# Patient Record
Sex: Female | Born: 1965
Health system: Southern US, Community
[De-identification: ages and names within clinical notes are randomized; demographics above are authoritative.]

## PROBLEM LIST (undated history)

## (undated) DIAGNOSIS — R519 Headache, unspecified: Secondary | ICD-10-CM

## (undated) DIAGNOSIS — D219 Benign neoplasm of connective and other soft tissue, unspecified: Secondary | ICD-10-CM

## (undated) DIAGNOSIS — R51 Headache: Secondary | ICD-10-CM

## (undated) DIAGNOSIS — A599 Trichomoniasis, unspecified: Secondary | ICD-10-CM

## (undated) DIAGNOSIS — I10 Essential (primary) hypertension: Secondary | ICD-10-CM

## (undated) HISTORY — PX: TUBAL LIGATION: SHX77

## (undated) HISTORY — PX: EYE SURGERY: SHX253

---

## 1998-08-08 ENCOUNTER — Inpatient Hospital Stay (HOSPITAL_COMMUNITY): Admission: AD | Admit: 1998-08-08 | Discharge: 1998-08-08 | Payer: Self-pay | Admitting: *Deleted

## 1998-08-09 ENCOUNTER — Inpatient Hospital Stay (HOSPITAL_COMMUNITY): Admission: AD | Admit: 1998-08-09 | Discharge: 1998-08-09 | Payer: Self-pay | Admitting: Obstetrics

## 1998-09-27 ENCOUNTER — Encounter: Admission: RE | Admit: 1998-09-27 | Discharge: 1998-09-27 | Payer: Self-pay | Admitting: Family Medicine

## 1998-10-06 ENCOUNTER — Encounter: Admission: RE | Admit: 1998-10-06 | Discharge: 1998-10-06 | Payer: Self-pay | Admitting: Family Medicine

## 1998-10-20 ENCOUNTER — Other Ambulatory Visit: Admission: RE | Admit: 1998-10-20 | Discharge: 1998-10-20 | Payer: Self-pay | Admitting: Family Medicine

## 1998-10-20 ENCOUNTER — Encounter: Admission: RE | Admit: 1998-10-20 | Discharge: 1998-10-20 | Payer: Self-pay | Admitting: Family Medicine

## 1998-10-23 ENCOUNTER — Ambulatory Visit (HOSPITAL_COMMUNITY): Admission: RE | Admit: 1998-10-23 | Discharge: 1998-10-23 | Payer: Self-pay | Admitting: Obstetrics

## 1998-11-17 ENCOUNTER — Encounter: Admission: RE | Admit: 1998-11-17 | Discharge: 1998-11-17 | Payer: Self-pay | Admitting: Family Medicine

## 1998-12-18 ENCOUNTER — Encounter: Admission: RE | Admit: 1998-12-18 | Discharge: 1998-12-18 | Payer: Self-pay | Admitting: Family Medicine

## 1999-01-15 ENCOUNTER — Inpatient Hospital Stay (HOSPITAL_COMMUNITY): Admission: AD | Admit: 1999-01-15 | Discharge: 1999-01-15 | Payer: Self-pay | Admitting: *Deleted

## 1999-01-19 ENCOUNTER — Encounter: Admission: RE | Admit: 1999-01-19 | Discharge: 1999-01-19 | Payer: Self-pay | Admitting: Family Medicine

## 1999-02-12 ENCOUNTER — Encounter: Admission: RE | Admit: 1999-02-12 | Discharge: 1999-02-12 | Payer: Self-pay | Admitting: Family Medicine

## 1999-03-19 ENCOUNTER — Encounter: Admission: RE | Admit: 1999-03-19 | Discharge: 1999-03-19 | Payer: Self-pay | Admitting: Family Medicine

## 1999-04-03 ENCOUNTER — Inpatient Hospital Stay (HOSPITAL_COMMUNITY): Admission: AD | Admit: 1999-04-03 | Discharge: 1999-04-03 | Payer: Self-pay | Admitting: *Deleted

## 1999-04-13 ENCOUNTER — Encounter: Admission: RE | Admit: 1999-04-13 | Discharge: 1999-04-13 | Payer: Self-pay | Admitting: Family Medicine

## 1999-04-18 ENCOUNTER — Encounter: Admission: RE | Admit: 1999-04-18 | Discharge: 1999-04-18 | Payer: Self-pay | Admitting: Family Medicine

## 1999-04-26 ENCOUNTER — Encounter: Payer: Self-pay | Admitting: Obstetrics & Gynecology

## 1999-04-26 ENCOUNTER — Inpatient Hospital Stay (HOSPITAL_COMMUNITY): Admission: AD | Admit: 1999-04-26 | Discharge: 1999-05-10 | Payer: Self-pay | Admitting: Obstetrics & Gynecology

## 1999-05-10 ENCOUNTER — Encounter: Payer: Self-pay | Admitting: Obstetrics & Gynecology

## 1999-05-15 ENCOUNTER — Inpatient Hospital Stay (HOSPITAL_COMMUNITY): Admission: AD | Admit: 1999-05-15 | Discharge: 1999-05-23 | Payer: Self-pay | Admitting: *Deleted

## 1999-05-21 ENCOUNTER — Encounter: Payer: Self-pay | Admitting: *Deleted

## 1999-05-21 ENCOUNTER — Encounter: Admission: RE | Admit: 1999-05-21 | Discharge: 1999-05-21 | Payer: Self-pay | Admitting: Family Medicine

## 1999-05-29 ENCOUNTER — Encounter: Admission: RE | Admit: 1999-05-29 | Discharge: 1999-05-29 | Payer: Self-pay | Admitting: Sports Medicine

## 1999-05-31 ENCOUNTER — Inpatient Hospital Stay (HOSPITAL_COMMUNITY): Admission: AD | Admit: 1999-05-31 | Discharge: 1999-06-11 | Payer: Self-pay | Admitting: Obstetrics & Gynecology

## 1999-06-13 ENCOUNTER — Encounter: Admission: RE | Admit: 1999-06-13 | Discharge: 1999-09-11 | Payer: Self-pay | Admitting: Obstetrics & Gynecology

## 1999-07-17 ENCOUNTER — Other Ambulatory Visit: Admission: RE | Admit: 1999-07-17 | Discharge: 1999-08-08 | Payer: Self-pay | Admitting: Family Medicine

## 1999-07-17 ENCOUNTER — Encounter: Admission: RE | Admit: 1999-07-17 | Discharge: 1999-07-17 | Payer: Self-pay | Admitting: Sports Medicine

## 2001-07-29 ENCOUNTER — Ambulatory Visit (HOSPITAL_COMMUNITY): Admission: RE | Admit: 2001-07-29 | Discharge: 2001-07-29 | Payer: Self-pay | Admitting: Obstetrics and Gynecology

## 2002-02-28 ENCOUNTER — Emergency Department (HOSPITAL_COMMUNITY): Admission: EM | Admit: 2002-02-28 | Discharge: 2002-02-28 | Payer: Self-pay | Admitting: Emergency Medicine

## 2003-03-24 ENCOUNTER — Emergency Department (HOSPITAL_COMMUNITY): Admission: EM | Admit: 2003-03-24 | Discharge: 2003-03-25 | Payer: Self-pay | Admitting: Emergency Medicine

## 2003-07-12 ENCOUNTER — Other Ambulatory Visit: Admission: RE | Admit: 2003-07-12 | Discharge: 2003-07-12 | Payer: Self-pay | Admitting: Obstetrics and Gynecology

## 2003-07-13 ENCOUNTER — Encounter: Admission: RE | Admit: 2003-07-13 | Discharge: 2003-07-13 | Payer: Self-pay | Admitting: Family Medicine

## 2003-08-11 ENCOUNTER — Emergency Department (HOSPITAL_COMMUNITY): Admission: EM | Admit: 2003-08-11 | Discharge: 2003-08-11 | Payer: Self-pay | Admitting: Emergency Medicine

## 2004-04-11 ENCOUNTER — Emergency Department (HOSPITAL_COMMUNITY): Admission: EM | Admit: 2004-04-11 | Discharge: 2004-04-11 | Payer: Self-pay | Admitting: Emergency Medicine

## 2005-03-27 ENCOUNTER — Other Ambulatory Visit: Admission: RE | Admit: 2005-03-27 | Discharge: 2005-03-27 | Payer: Self-pay | Admitting: Obstetrics and Gynecology

## 2005-03-28 ENCOUNTER — Other Ambulatory Visit: Admission: RE | Admit: 2005-03-28 | Discharge: 2005-03-28 | Payer: Self-pay | Admitting: Obstetrics and Gynecology

## 2005-05-09 ENCOUNTER — Encounter: Admission: RE | Admit: 2005-05-09 | Discharge: 2005-05-09 | Payer: Self-pay | Admitting: Endocrinology

## 2005-06-03 ENCOUNTER — Emergency Department (HOSPITAL_COMMUNITY): Admission: EM | Admit: 2005-06-03 | Discharge: 2005-06-03 | Payer: Self-pay | Admitting: Emergency Medicine

## 2006-08-28 ENCOUNTER — Other Ambulatory Visit: Admission: RE | Admit: 2006-08-28 | Discharge: 2006-08-28 | Payer: Self-pay | Admitting: Obstetrics and Gynecology

## 2009-09-20 ENCOUNTER — Other Ambulatory Visit: Admission: RE | Admit: 2009-09-20 | Discharge: 2009-09-20 | Payer: Self-pay | Admitting: Obstetrics and Gynecology

## 2009-09-22 ENCOUNTER — Encounter: Admission: RE | Admit: 2009-09-22 | Discharge: 2009-09-22 | Payer: Self-pay | Admitting: Obstetrics and Gynecology

## 2010-12-06 ENCOUNTER — Other Ambulatory Visit (HOSPITAL_COMMUNITY)
Admission: RE | Admit: 2010-12-06 | Discharge: 2010-12-06 | Disposition: A | Discharge status: Home / Self Care | Payer: Medicaid Other | Source: Ambulatory Visit | Attending: Obstetrics and Gynecology | Admitting: Obstetrics and Gynecology

## 2010-12-06 ENCOUNTER — Other Ambulatory Visit: Payer: Self-pay | Admitting: Obstetrics and Gynecology

## 2010-12-06 DIAGNOSIS — Z01419 Encounter for gynecological examination (general) (routine) without abnormal findings: Secondary | ICD-10-CM | POA: Insufficient documentation

## 2010-12-06 DIAGNOSIS — Z113 Encounter for screening for infections with a predominantly sexual mode of transmission: Secondary | ICD-10-CM | POA: Insufficient documentation

## 2010-12-07 ENCOUNTER — Other Ambulatory Visit: Payer: Self-pay | Admitting: Obstetrics and Gynecology

## 2010-12-07 DIAGNOSIS — Z1231 Encounter for screening mammogram for malignant neoplasm of breast: Secondary | ICD-10-CM

## 2010-12-18 ENCOUNTER — Ambulatory Visit
Admission: RE | Admit: 2010-12-18 | Discharge: 2010-12-18 | Disposition: A | Discharge status: Home / Self Care | Payer: Medicaid Other | Source: Ambulatory Visit | Attending: Obstetrics and Gynecology | Admitting: Obstetrics and Gynecology

## 2010-12-18 DIAGNOSIS — Z1231 Encounter for screening mammogram for malignant neoplasm of breast: Secondary | ICD-10-CM

## 2011-02-14 ENCOUNTER — Emergency Department (HOSPITAL_COMMUNITY)
Admission: EM | Admit: 2011-02-14 | Discharge: 2011-02-15 | Disposition: A | Discharge status: Home / Self Care | Payer: Medicaid Other | Attending: Emergency Medicine | Admitting: Emergency Medicine

## 2011-02-14 DIAGNOSIS — R51 Headache: Secondary | ICD-10-CM | POA: Insufficient documentation

## 2011-02-15 ENCOUNTER — Emergency Department (HOSPITAL_COMMUNITY): Payer: Medicaid Other

## 2011-02-15 ENCOUNTER — Encounter (HOSPITAL_COMMUNITY): Payer: Self-pay

## 2011-02-15 LAB — POCT I-STAT, CHEM 8
BUN: 8 mg/dL (ref 6–23)
Calcium, Ion: 1.12 mmol/L (ref 1.12–1.32)
Chloride: 106 mEq/L (ref 96–112)
Creatinine, Ser: 0.6 mg/dL (ref 0.50–1.10)
Glucose, Bld: 117 mg/dL — ABNORMAL HIGH (ref 70–99)
HCT: 37 % (ref 36.0–46.0)
Hemoglobin: 12.6 g/dL (ref 12.0–15.0)
Potassium: 3.2 mEq/L — ABNORMAL LOW (ref 3.5–5.1)
Sodium: 141 mEq/L (ref 135–145)
TCO2: 18 mmol/L (ref 0–100)

## 2011-04-27 ENCOUNTER — Emergency Department (HOSPITAL_COMMUNITY)
Admission: EM | Admit: 2011-04-27 | Discharge: 2011-04-27 | Disposition: A | Discharge status: Home / Self Care | Payer: Medicaid Other | Attending: Emergency Medicine | Admitting: Emergency Medicine

## 2011-04-27 ENCOUNTER — Emergency Department (HOSPITAL_COMMUNITY): Payer: Medicaid Other

## 2011-04-27 DIAGNOSIS — M7989 Other specified soft tissue disorders: Secondary | ICD-10-CM | POA: Insufficient documentation

## 2011-04-27 DIAGNOSIS — M79609 Pain in unspecified limb: Secondary | ICD-10-CM | POA: Insufficient documentation

## 2011-04-27 DIAGNOSIS — W208XXA Other cause of strike by thrown, projected or falling object, initial encounter: Secondary | ICD-10-CM | POA: Insufficient documentation

## 2012-02-07 ENCOUNTER — Other Ambulatory Visit: Payer: Self-pay | Admitting: Family Medicine

## 2012-02-07 DIAGNOSIS — Z1231 Encounter for screening mammogram for malignant neoplasm of breast: Secondary | ICD-10-CM

## 2012-02-19 ENCOUNTER — Other Ambulatory Visit: Payer: Self-pay | Admitting: Family Medicine

## 2012-02-19 DIAGNOSIS — N926 Irregular menstruation, unspecified: Secondary | ICD-10-CM

## 2012-02-20 ENCOUNTER — Other Ambulatory Visit: Payer: Medicaid Other

## 2012-02-21 ENCOUNTER — Ambulatory Visit
Admission: RE | Admit: 2012-02-21 | Discharge: 2012-02-21 | Disposition: A | Discharge status: Home / Self Care | Payer: Medicaid Other | Source: Ambulatory Visit | Attending: Family Medicine | Admitting: Family Medicine

## 2012-02-21 ENCOUNTER — Ambulatory Visit: Payer: Medicaid Other

## 2012-02-21 DIAGNOSIS — N926 Irregular menstruation, unspecified: Secondary | ICD-10-CM

## 2013-01-12 ENCOUNTER — Emergency Department (HOSPITAL_COMMUNITY)
Admission: EM | Admit: 2013-01-12 | Discharge: 2013-01-13 | Disposition: A | Discharge status: Home / Self Care | Payer: No Typology Code available for payment source | Attending: Emergency Medicine | Admitting: Emergency Medicine

## 2013-01-12 ENCOUNTER — Encounter (HOSPITAL_COMMUNITY): Payer: Self-pay | Admitting: Emergency Medicine

## 2013-01-12 DIAGNOSIS — Y92410 Unspecified street and highway as the place of occurrence of the external cause: Secondary | ICD-10-CM | POA: Insufficient documentation

## 2013-01-12 DIAGNOSIS — S0990XA Unspecified injury of head, initial encounter: Secondary | ICD-10-CM | POA: Insufficient documentation

## 2013-01-12 DIAGNOSIS — S46909A Unspecified injury of unspecified muscle, fascia and tendon at shoulder and upper arm level, unspecified arm, initial encounter: Secondary | ICD-10-CM | POA: Insufficient documentation

## 2013-01-12 DIAGNOSIS — Y9389 Activity, other specified: Secondary | ICD-10-CM | POA: Insufficient documentation

## 2013-01-12 DIAGNOSIS — Z9889 Other specified postprocedural states: Secondary | ICD-10-CM | POA: Insufficient documentation

## 2013-01-12 DIAGNOSIS — S0993XA Unspecified injury of face, initial encounter: Secondary | ICD-10-CM | POA: Insufficient documentation

## 2013-01-12 DIAGNOSIS — S4980XA Other specified injuries of shoulder and upper arm, unspecified arm, initial encounter: Secondary | ICD-10-CM | POA: Insufficient documentation

## 2013-01-12 DIAGNOSIS — H11009 Unspecified pterygium of unspecified eye: Secondary | ICD-10-CM | POA: Insufficient documentation
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
## 2013-01-12 NOTE — ED Notes (Signed)
Pt ambulatory to exam room with steady gait.  

## 2013-01-13 MED ORDER — METHOCARBAMOL 500 MG PO TABS: 500.0000 mg | ORAL_TABLET | Freq: Two times a day (BID) | ORAL | Status: DC

## 2013-01-13 MED ORDER — IBUPROFEN 800 MG PO TABS: 800.0000 mg | ORAL_TABLET | Freq: Three times a day (TID) | ORAL | Status: DC

## 2013-01-13 MED ORDER — IBUPROFEN 800 MG PO TABS
800.0000 mg | ORAL_TABLET | Freq: Once | ORAL | Status: AC
  Administered 2013-01-13: 800 mg via ORAL
  Filled 2013-01-13: qty 1

## 2013-01-15 NOTE — ED Provider Notes (Signed)
Medical screening examination/treatment/procedure(s) were performed by non-physician practitioner and as supervising physician I was immediately available for consultation/collaboration.    Celene Kras, MD 01/15/13 [phone]

## 2014-08-16 ENCOUNTER — Emergency Department (HOSPITAL_COMMUNITY)
Admission: EM | Admit: 2014-08-16 | Discharge: 2014-08-16 | Disposition: A | Discharge status: Home / Self Care | Payer: Self-pay | Attending: Emergency Medicine | Admitting: Emergency Medicine

## 2014-08-16 ENCOUNTER — Encounter (HOSPITAL_COMMUNITY): Payer: Self-pay | Admitting: Emergency Medicine

## 2014-08-16 ENCOUNTER — Emergency Department (HOSPITAL_COMMUNITY): Payer: Medicaid Other

## 2014-08-16 DIAGNOSIS — M549 Dorsalgia, unspecified: Secondary | ICD-10-CM

## 2014-08-16 DIAGNOSIS — M546 Pain in thoracic spine: Secondary | ICD-10-CM | POA: Insufficient documentation

## 2014-08-16 MED ORDER — HYDROCODONE-ACETAMINOPHEN 5-325 MG PO TABS: 1.0000 | ORAL_TABLET | ORAL | Status: DC | PRN

## 2014-08-16 MED ORDER — CYCLOBENZAPRINE HCL 10 MG PO TABS: 10.0000 mg | ORAL_TABLET | Freq: Three times a day (TID) | ORAL | Status: DC | PRN

## 2014-08-16 MED ORDER — OXYCODONE-ACETAMINOPHEN 5-325 MG PO TABS
1.0000 | ORAL_TABLET | Freq: Once | ORAL | Status: AC
  Administered 2014-08-16: 1 via ORAL
  Filled 2014-08-16: qty 1

## 2014-08-16 MED ORDER — KETOROLAC TROMETHAMINE 60 MG/2ML IM SOLN
60.0000 mg | Freq: Once | INTRAMUSCULAR | Status: AC
  Administered 2014-08-16: 60 mg via INTRAMUSCULAR
  Filled 2014-08-16: qty 2

## 2014-08-16 MED ORDER — IBUPROFEN 600 MG PO TABS: 600.0000 mg | ORAL_TABLET | Freq: Three times a day (TID) | ORAL | Status: DC | PRN

## 2014-08-16 MED ORDER — ONDANSETRON 4 MG PO TBDP
8.0000 mg | ORAL_TABLET | Freq: Once | ORAL | Status: AC
  Administered 2014-08-16: 8 mg via ORAL
  Filled 2014-08-16: qty 2

## 2014-08-16 MED ORDER — HYDROMORPHONE HCL 1 MG/ML IJ SOLN
1.0000 mg | Freq: Once | INTRAMUSCULAR | Status: AC
  Administered 2014-08-16: 1 mg via INTRAMUSCULAR
  Filled 2014-08-16: qty 1

## 2014-08-16 MED ORDER — DIAZEPAM 5 MG PO TABS
5.0000 mg | ORAL_TABLET | Freq: Once | ORAL | Status: AC
  Administered 2014-08-16: 5 mg via ORAL
  Filled 2014-08-16: qty 1

## 2014-08-16 NOTE — ED Provider Notes (Signed)
CSN: [id number]     Arrival date & time 08/16/14  0417 History   First MD Initiated Contact with Patient 08/16/14 0505     Chief Complaint  Patient presents with  . Back Pain      The history is provided by the patient.   Patient reports mid back pain since yesterday.  Worse with bending over and worse with movement.  She denies shortness of breath.  No recent injury or trauma.  Pain is moderate to severe in severity.  Nonpleuritic pain.  No shortness of breath.  No fevers or chills.  Denies productive cough.  No abdominal pain.  No low back pain.  No urinary complaints.   History reviewed. No pertinent past medical history. Past Surgical History  Procedure Laterality Date  . Eye surgery    . Tubal ligation     Family History  Problem Relation Age of Onset  . Obesity Other   . Hypertension Other   . Glaucoma Other   . Cancer Other    History  Substance Use Topics  . Smoking status: Never Smoker   . Smokeless tobacco: Not on file  . Alcohol Use: Yes     Comment: rare   OB History    No data available     Review of Systems  Musculoskeletal: Positive for back pain.  All other systems reviewed and are negative.     Allergies  Review of patient's allergies indicates no known allergies.  Home Medications   Prior to Admission medications   Medication Sig Start Date End Date Taking? Authorizing Provider  acetaminophen (TYLENOL) 325 MG tablet Take 650 mg by mouth every 6 (six) hours as needed for mild pain.   Yes Historical Provider, MD  caffeine 200 MG TABS tablet Take 200 mg by mouth every 4 (four) hours as needed (for pain).   Yes Historical Provider, MD  cyclobenzaprine (FLEXERIL) 10 MG tablet Take 1 tablet (10 mg total) by mouth 3 (three) times daily as needed for muscle spasms. 08/16/14   Hoy Morn, MD  HYDROcodone-acetaminophen (NORCO/VICODIN) 5-325 MG per tablet Take 1 tablet by mouth every 4 (four) hours as needed for moderate pain. 08/16/14   Hoy Morn, MD  ibuprofen (ADVIL,MOTRIN) 600 MG tablet Take 1 tablet (600 mg total) by mouth every 8 (eight) hours as needed. 08/16/14   Hoy Morn, MD  methocarbamol (ROBAXIN) 500 MG tablet Take 1 tablet (500 mg total) by mouth 2 (two) times daily. Patient not taking: Reported on 08/16/2014 01/13/13   Domenic Moras, PA-C  Multiple Vitamin (MULTIVITAMIN WITH MINERALS) TABS Take 1 tablet by mouth daily.    Historical Provider, MD   BP 129/75 mmHg  Pulse 98  Temp(Src) 97.5 F (36.4 C) (Oral)  Resp 18  SpO2 97% Physical Exam  Constitutional: She is oriented to person, place, and time. She appears well-developed and well-nourished. No distress.  HENT:  Head: Normocephalic and atraumatic.  Eyes: EOM are normal.  Neck: Normal range of motion.  Cardiovascular: Normal rate, regular rhythm and normal heart sounds.   Pulmonary/Chest: Effort normal and breath sounds normal.  Abdominal: Soft. She exhibits no distension. There is no tenderness.  Musculoskeletal: Normal range of motion.  Mild thoracic and parathoracic tenderness right greater than left.  No lumbar or paralumbar tenderness.  No cervical or paracervical tenderness  Neurological: She is alert and oriented to person, place, and time.  Skin: Skin is warm and dry.  Psychiatric: She has a normal  mood and affect. Judgment normal.  Nursing note and vitals reviewed.   ED Course  Procedures (including critical care time) Labs Review Labs Reviewed - No data to display  Imaging Review Dg Chest 2 View  08/16/2014   CLINICAL DATA:  Acute onset of mid back discomfort, extending about the left side. Initial encounter.  EXAM: CHEST  2 VIEW  COMPARISON:  Chest radiograph performed 06/03/2005  FINDINGS: The lungs are well-aerated. Pulmonary vascularity is at the upper limits of normal. There is no evidence of focal opacification, pleural effusion or pneumothorax.  The heart is borderline normal in size. No acute osseous abnormalities are seen.   IMPRESSION: No acute cardiopulmonary process seen.   Electronically Signed   By: Garald Balding M.D.   On: 08/16/2014 05:56   Dg Thoracic Spine 2 View  08/16/2014   CLINICAL DATA:  Acute onset of mid back discomfort, extending about the left side. Initial encounter.  EXAM: THORACIC SPINE - 2 VIEW  COMPARISON:  Chest radiograph performed 06/03/2005  FINDINGS: There is no evidence of fracture or subluxation. Vertebral bodies demonstrate normal height and alignment. Intervertebral disc spaces are preserved.  The visualized portions of both lungs are clear. The mediastinum is unremarkable in appearance.  IMPRESSION: No evidence of fracture or subluxation along the thoracic spine.   Electronically Signed   By: Garald Balding M.D.   On: 08/16/2014 05:56  I personally reviewed the imaging tests through PACS system I reviewed available ER/hospitalization records through the EMR    EKG Interpretation None      MDM   Final diagnoses:  Back pain    Patient feeling better at this time.  Discharge home in good condition.  X-rays normal.  Abdomen benign.  5 out of 5 strength in bilateral upper and lower extremities.  Home with pain management for likely musculoskeletal back pain.  Doubt ACS.  Doubt PE.    Hoy Morn, MD 08/16/14 [phone]

## 2014-08-16 NOTE — Discharge Instructions (Signed)
Back Pain, Adult Low back pain is very common. About 1 in 5 people have back pain.The cause of low back pain is rarely dangerous. The pain often gets better over time.About half of people with a sudden onset of back pain feel better in just 2 weeks. About 8 in 10 people feel better by 6 weeks.  CAUSES Some common causes of back pain include:  Strain of the muscles or ligaments supporting the spine.  Wear and tear (degeneration) of the spinal discs.  Arthritis.  Direct injury to the back. DIAGNOSIS Most of the time, the direct cause of low back pain is not known.However, back pain can be treated effectively even when the exact cause of the pain is unknown.Answering your caregiver's questions about your overall health and symptoms is one of the most accurate ways to make sure the cause of your pain is not dangerous. If your caregiver needs more information, he or she may order lab work or imaging tests (X-rays or MRIs).However, even if imaging tests show changes in your back, this usually does not require surgery. HOME CARE INSTRUCTIONS For many people, back pain returns.Since low back pain is rarely dangerous, it is often a condition that people can learn to manageon their own.   Remain active. It is stressful on the back to sit or stand in one place. Do not sit, drive, or stand in one place for more than 30 minutes at a time. Take short walks on level surfaces as soon as pain allows.Try to increase the length of time you walk each day.  Do not stay in bed.Resting more than 1 or 2 days can delay your recovery.  Do not avoid exercise or work.Your body is made to move.It is not dangerous to be active, even though your back may hurt.Your back will likely heal faster if you return to being active before your pain is gone.  Pay attention to your body when you bend and lift. Many people have less discomfortwhen lifting if they bend their knees, keep the load close to their bodies,and  avoid twisting. Often, the most comfortable positions are those that put less stress on your recovering back.  Find a comfortable position to sleep. Use a firm mattress and lie on your side with your knees slightly bent. If you lie on your back, put a pillow under your knees.  Only take over-the-counter or prescription medicines as directed by your caregiver. Over-the-counter medicines to reduce pain and inflammation are often the most helpful.Your caregiver may prescribe muscle relaxant drugs.These medicines help dull your pain so you can more quickly return to your normal activities and healthy exercise.  Put ice on the injured area.  Put ice in a plastic bag.  Place a towel between your skin and the bag.  Leave the ice on for 15-20 minutes, 03-04 times a day for the first 2 to 3 days. After that, ice and heat may be alternated to reduce pain and spasms.  Ask your caregiver about trying back exercises and gentle massage. This may be of some benefit.  Avoid feeling anxious or stressed.Stress increases muscle tension and can worsen back pain.It is important to recognize when you are anxious or stressed and learn ways to manage it.Exercise is a great option. SEEK MEDICAL CARE IF:  You have pain that is not relieved with rest or medicine.  You have pain that does not improve in 1 week.  You have new symptoms.  You are generally not feeling well. SEEK   IMMEDIATE MEDICAL CARE IF:   You have pain that radiates from your back into your legs.  You develop new bowel or bladder control problems.  You have unusual weakness or numbness in your arms or legs.  You develop nausea or vomiting.  You develop abdominal pain.  You feel faint. Document Released: 08/12/2005 Document Revised: 02/11/2012 Document Reviewed: 12/14/2013 Exit[employer] Patient Information 2015 [employer]. This information is not intended to replace advice given to you by your health care provider. Make sure you  discuss any questions you have with your health care provider.  

## 2014-08-16 NOTE — ED Notes (Signed)
Pt. reports mid back pain onset yesterday worse with movement and certain positions , denies injury or strenuous / no urinary discomfort .

## 2015-08-16 ENCOUNTER — Encounter (HOSPITAL_COMMUNITY): Payer: Self-pay | Admitting: *Deleted

## 2015-08-16 ENCOUNTER — Inpatient Hospital Stay (HOSPITAL_COMMUNITY)
Admission: AD | Admit: 2015-08-16 | Discharge: 2015-08-16 | Disposition: A | Discharge status: Home / Self Care | Payer: Self-pay | Source: Ambulatory Visit | Attending: Family Medicine | Admitting: Family Medicine

## 2015-08-16 DIAGNOSIS — N939 Abnormal uterine and vaginal bleeding, unspecified: Secondary | ICD-10-CM | POA: Insufficient documentation

## 2015-08-16 DIAGNOSIS — M549 Dorsalgia, unspecified: Secondary | ICD-10-CM | POA: Insufficient documentation

## 2015-08-16 HISTORY — DX: Headache: R51

## 2015-08-16 HISTORY — DX: Headache, unspecified: R51.9

## 2015-08-16 HISTORY — DX: Trichomoniasis, unspecified: A59.9

## 2015-08-16 HISTORY — DX: Benign neoplasm of connective and other soft tissue, unspecified: D21.9

## 2015-08-16 LAB — WET PREP, GENITAL
Sperm: NONE SEEN
Trich, Wet Prep: NONE SEEN
YEAST WET PREP: NONE SEEN

## 2015-08-16 LAB — URINALYSIS, ROUTINE W REFLEX MICROSCOPIC
Bilirubin Urine: NEGATIVE
Glucose, UA: NEGATIVE mg/dL
Ketones, ur: NEGATIVE mg/dL
Leukocytes, UA: NEGATIVE
NITRITE: NEGATIVE
Protein, ur: NEGATIVE mg/dL
Specific Gravity, Urine: 1.01 (ref 1.005–1.030)
pH: 6.5 (ref 5.0–8.0)

## 2015-08-16 LAB — URINE MICROSCOPIC-ADD ON: WBC UA: NONE SEEN WBC/hpf (ref 0–5)

## 2015-08-16 LAB — POCT PREGNANCY, URINE: PREG TEST UR: NEGATIVE

## 2015-08-16 LAB — CBC
HCT: 40.3 % (ref 36.0–46.0)
Hemoglobin: 13.5 g/dL (ref 12.0–15.0)
MCH: 30.9 pg (ref 26.0–34.0)
MCHC: 33.5 g/dL (ref 30.0–36.0)
MCV: 92.2 fL (ref 78.0–100.0)
PLATELETS: 218 10*3/uL (ref 150–400)
RBC: 4.37 MIL/uL (ref 3.87–5.11)
RDW: 14.3 % (ref 11.5–15.5)
WBC: 5.1 10*3/uL (ref 4.0–10.5)

## 2015-08-16 NOTE — MAU Provider Note (Signed)
History     CSN: [id number]  Arrival date and time: 08/16/15 1024   First Provider Initiated Contact with Patient 08/16/15 1058      Chief Complaint  Patient presents with  . Back Pain  . Vaginal Bleeding   HPI Kari Blake 49 y.o. [id number] nonpregnant female presents to MAU for vaginal bleeding and back pain.  Pain is 6/10, left side of mid-back.  It feels like a pinching pain.  She has tried drinking extra water in case it's a kidney infection as well as ibuprofen, tylenol.  They have helped some but make her sleepy.  The bleeding started 8 days ago.  She has had irregular periods over the last few months.  It was gone for several months and now it has occurred more frequently over the last 6 weeks.  Today her period is light.  She denies abdominal cramping, fever, weakness, syncope, vaginal discharge, odor.   OB History    Gravida Para Term Preterm AB TAB SAB Ectopic Multiple Living   6 2 2  4 4    2       Past Medical History  Diagnosis Date  . Headache   . Fibroid   . Trichomonas infection     Past Surgical History  Procedure Laterality Date  . Eye surgery    . Tubal ligation    . Cesarean section      Family History  Problem Relation Age of Onset  . Obesity Other   . Hypertension Other   . Glaucoma Other   . Cancer Other     Social History  Substance Use Topics  . Smoking status: Never Smoker   . Smokeless tobacco: None  . Alcohol Use: Yes     Comment: rare    Allergies: No Known Allergies  Prescriptions prior to admission  Medication Sig Dispense Refill Last Dose  . acetaminophen (TYLENOL) 325 MG tablet Take 650 mg by mouth every 6 (six) hours as needed for mild pain.   08/15/2014 at Unknown time  . caffeine 200 MG TABS tablet Take 200 mg by mouth every 4 (four) hours as needed (for pain).   08/15/2014 at Unknown time  . cyclobenzaprine (FLEXERIL) 10 MG tablet Take 1 tablet (10 mg total) by mouth 3 (three) times daily as needed for muscle  spasms. 20 tablet 0   . HYDROcodone-acetaminophen (NORCO/VICODIN) 5-325 MG per tablet Take 1 tablet by mouth every 4 (four) hours as needed for moderate pain. 10 tablet 0   . ibuprofen (ADVIL,MOTRIN) 600 MG tablet Take 1 tablet (600 mg total) by mouth every 8 (eight) hours as needed. 15 tablet 0   . methocarbamol (ROBAXIN) 500 MG tablet Take 1 tablet (500 mg total) by mouth 2 (two) times daily. (Patient not taking: Reported on 08/16/2014) 20 tablet 0 Not Taking at Unknown time  . Multiple Vitamin (MULTIVITAMIN WITH MINERALS) TABS Take 1 tablet by mouth daily.   Not Taking at Unknown time    ROS Pertinent ROS in HPI.  All other systems are negative.   Physical Exam   Blood pressure 146/90, pulse 75, temperature 97.4 F (36.3 C), temperature source Oral, resp. rate 18, weight 187 lb 12.8 oz (85.186 kg).  Physical Exam  Constitutional: She is oriented to person, place, and time. She appears well-developed and well-nourished.  HENT:  Head: Normocephalic and atraumatic.  Eyes: Conjunctivae and EOM are normal.  Neck: Normal range of motion. Neck supple.  Cardiovascular: Normal rate, regular rhythm and normal  heart sounds.   Respiratory: Effort normal and breath sounds normal. No respiratory distress.  GI: Soft. Bowel sounds are normal. She exhibits no distension. There is no tenderness. There is no rebound and no guarding.  Genitourinary:  Mod amt of dark brown blood in vault.  No active bleeding.  No CMT.  No adnexal mass or tenderness appreciated.  Unable to detect fundus due to habitus.    Musculoskeletal: Normal range of motion.  Neurological: She is alert and oriented to person, place, and time.  Skin: Skin is warm and dry.  Psychiatric: She has a normal mood and affect. Her behavior is normal.   Results for orders placed or performed during the hospital encounter of 08/16/15 (from the past 24 hour(s))  Urinalysis, Routine w reflex microscopic (not at [facility])     Status: Abnormal    Collection Time: 08/16/15 10:36 AM  Result Value Ref Range   Color, Urine YELLOW YELLOW   APPearance HAZY (A) CLEAR   Specific Gravity, Urine 1.010 1.005 - 1.030   pH 6.5 5.0 - 8.0   Glucose, UA NEGATIVE NEGATIVE mg/dL   Hgb urine dipstick LARGE (A) NEGATIVE   Bilirubin Urine NEGATIVE NEGATIVE   Ketones, ur NEGATIVE NEGATIVE mg/dL   Protein, ur NEGATIVE NEGATIVE mg/dL   Nitrite NEGATIVE NEGATIVE   Leukocytes, UA NEGATIVE NEGATIVE  Urine microscopic-add on     Status: Abnormal   Collection Time: 08/16/15 10:36 AM  Result Value Ref Range   Squamous Epithelial / LPF 0-5 (A) NONE SEEN   WBC, UA NONE SEEN 0 - 5 WBC/hpf   RBC / HPF 0-5 0 - 5 RBC/hpf   Bacteria, UA FEW (A) NONE SEEN  Pregnancy, urine POC     Status: None   Collection Time: 08/16/15 10:45 AM  Result Value Ref Range   Preg Test, Ur NEGATIVE NEGATIVE  CBC     Status: None   Collection Time: 08/16/15 10:56 AM  Result Value Ref Range   WBC 5.1 4.0 - 10.5 K/uL   RBC 4.37 3.87 - 5.11 MIL/uL   Hemoglobin 13.5 12.0 - 15.0 g/dL   HCT 40.3 36.0 - 46.0 %   MCV 92.2 78.0 - 100.0 fL   MCH 30.9 26.0 - 34.0 pg   MCHC 33.5 30.0 - 36.0 g/dL   RDW 14.3 11.5 - 15.5 %   Platelets 218 150 - 400 K/uL  Wet prep, genital     Status: Abnormal   Collection Time: 08/16/15 11:20 AM  Result Value Ref Range   Yeast Wet Prep HPF POC NONE SEEN NONE SEEN   Trich, Wet Prep NONE SEEN NONE SEEN   Clue Cells Wet Prep HPF POC FEW (A) NONE SEEN   WBC, Wet Prep HPF POC FEW (A) NONE SEEN   Sperm NONE SEEN     MAU Course  Procedures  MDM Hemodynamically stable No notable infection   Assessment and Plan  A:  1. Abnormal uterine bleeding (AUB)    P: Discharge to home F/u with GYN provider See PCP to discuss fatigue, blood pressure Patient may return to MAU as needed or if her condition were to change or worsen   Paticia Stack 08/16/2015, 10:59 AM

## 2015-08-16 NOTE — Discharge Instructions (Signed)
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
## 2015-08-16 NOTE — MAU Note (Signed)
Hard copy for e-signature obtained, sent to med records.

## 2015-08-17 LAB — GC/CHLAMYDIA PROBE AMP (~~LOC~~) NOT AT ARMC
CHLAMYDIA, DNA PROBE: NEGATIVE
NEISSERIA GONORRHEA: NEGATIVE

## 2015-08-17 LAB — HIV ANTIBODY (ROUTINE TESTING W REFLEX): HIV Screen 4th Generation wRfx: NONREACTIVE

## 2016-07-17 ENCOUNTER — Other Ambulatory Visit: Payer: Self-pay | Admitting: Physician Assistant

## 2016-07-17 ENCOUNTER — Other Ambulatory Visit: Payer: Self-pay | Admitting: *Deleted

## 2016-07-17 DIAGNOSIS — Z1231 Encounter for screening mammogram for malignant neoplasm of breast: Secondary | ICD-10-CM

## 2016-09-25 ENCOUNTER — Emergency Department (HOSPITAL_COMMUNITY): Payer: BLUE CROSS/BLUE SHIELD

## 2016-09-25 ENCOUNTER — Encounter (HOSPITAL_COMMUNITY): Payer: Self-pay

## 2016-09-25 ENCOUNTER — Emergency Department (HOSPITAL_COMMUNITY)
Admission: EM | Admit: 2016-09-25 | Discharge: 2016-09-25 | Disposition: A | Discharge status: Home / Self Care | Payer: BLUE CROSS/BLUE SHIELD | Attending: Emergency Medicine | Admitting: Emergency Medicine

## 2016-09-25 DIAGNOSIS — R350 Frequency of micturition: Secondary | ICD-10-CM | POA: Diagnosis not present

## 2016-09-25 DIAGNOSIS — R109 Unspecified abdominal pain: Secondary | ICD-10-CM | POA: Diagnosis not present

## 2016-09-25 LAB — CBC WITH DIFFERENTIAL/PLATELET
Basophils Absolute: 0 10*3/uL (ref 0.0–0.1)
Basophils Relative: 0 %
Eosinophils Absolute: 0 10*3/uL (ref 0.0–0.7)
Eosinophils Relative: 0 %
HCT: 38.6 % (ref 36.0–46.0)
Hemoglobin: 12.8 g/dL (ref 12.0–15.0)
Lymphocytes Relative: 20 %
Lymphs Abs: 1.7 10*3/uL (ref 0.7–4.0)
MCH: 30.5 pg (ref 26.0–34.0)
MCHC: 33.2 g/dL (ref 30.0–36.0)
MCV: 92.1 fL (ref 78.0–100.0)
Monocytes Absolute: 0.6 10*3/uL (ref 0.1–1.0)
Monocytes Relative: 7 %
Neutro Abs: 6 10*3/uL (ref 1.7–7.7)
Neutrophils Relative %: 73 %
Platelets: 180 10*3/uL (ref 150–400)
RBC: 4.19 MIL/uL (ref 3.87–5.11)
RDW: 13.7 % (ref 11.5–15.5)
WBC: 8.3 10*3/uL (ref 4.0–10.5)

## 2016-09-25 LAB — COMPREHENSIVE METABOLIC PANEL
ALT: 78 U/L — ABNORMAL HIGH (ref 14–54)
AST: 96 U/L — ABNORMAL HIGH (ref 15–41)
Albumin: 3.2 g/dL — ABNORMAL LOW (ref 3.5–5.0)
Alkaline Phosphatase: 74 U/L (ref 38–126)
Anion gap: 9 (ref 5–15)
BUN: 9 mg/dL (ref 6–20)
CO2: 24 mmol/L (ref 22–32)
Calcium: 8.7 mg/dL — ABNORMAL LOW (ref 8.9–10.3)
Chloride: 105 mmol/L (ref 101–111)
Creatinine, Ser: 0.89 mg/dL (ref 0.44–1.00)
GFR calc Af Amer: >60 mL/min (ref >60)
GFR calc non Af Amer: >60 mL/min (ref >60)
Glucose, Bld: 140 mg/dL — ABNORMAL HIGH (ref 65–99)
Potassium: 3.3 mmol/L — ABNORMAL LOW (ref 3.5–5.1)
Sodium: 138 mmol/L (ref 135–145)
Total Bilirubin: 0.7 mg/dL (ref 0.3–1.2)
Total Protein: 6.5 g/dL (ref 6.5–8.1)

## 2016-09-25 LAB — URINALYSIS, ROUTINE W REFLEX MICROSCOPIC
BILIRUBIN URINE: NEGATIVE
Glucose, UA: NEGATIVE mg/dL
HGB URINE DIPSTICK: NEGATIVE
Ketones, ur: NEGATIVE mg/dL
Leukocytes, UA: NEGATIVE
Nitrite: NEGATIVE
PROTEIN: NEGATIVE mg/dL
SPECIFIC GRAVITY, URINE: 1.018 (ref 1.005–1.030)
pH: 7 (ref 5.0–8.0)

## 2016-09-25 LAB — POC URINE PREG, ED: Preg Test, Ur: NEGATIVE

## 2016-09-25 MED ORDER — IBUPROFEN 800 MG PO TABS: 800.0000 mg | ORAL_TABLET | Freq: Three times a day (TID) | ORAL | 0 refills | Status: DC | PRN

## 2016-09-25 MED ORDER — SODIUM CHLORIDE 0.9 % IV BOLUS (SEPSIS)
1000.0000 mL | Freq: Once | INTRAVENOUS | Status: AC
  Administered 2016-09-25: 1000 mL via INTRAVENOUS

## 2016-09-25 MED ORDER — ONDANSETRON HCL 4 MG/2ML IJ SOLN
4.0000 mg | Freq: Once | INTRAMUSCULAR | Status: AC
  Administered 2016-09-25: 4 mg via INTRAVENOUS
  Filled 2016-09-25: qty 2

## 2016-09-25 NOTE — ED Notes (Signed)
Pt reports that she is having frequent urination with pain around the flanks. Pt denies any blood in her urine. Pt reports she was told by her doctor that she has a fibroid.

## 2016-09-25 NOTE — Discharge Instructions (Signed)
Return here as needed. Follow up with your doctor. You do have fibroids this can cause pain as well.

## 2016-09-25 NOTE — ED Triage Notes (Signed)
Pt states she feels she has a UTI; pt reports frequent urination with lower back pain; Pt c/o pain at 7/10 on arrival; Pt a&ox 4 on arrival

## 2016-09-28 NOTE — ED Provider Notes (Signed)
[place] DEPT [facility] Provider Note   CSN: [id number] Arrival date & time: 09/25/16  0010     History   Chief Complaint Chief Complaint  Patient presents with  . Urinary Frequency  . Back Pain    HPI Kari Blake is a 51 y.o. female.  HPI Patient presents to the emergency department with urinary frequency and lower back discomfort.  The patient states that she has like she may have a urinary tract infection.  She states that she has also had some generalized weakness.  Patient states that the symptoms started 4 days ago.  Patient did not take any medications prior to arrival.  Nothing seems make her condition better or worse. The patient denies chest pain, shortness of breath, headache,blurred vision, neck pain, fever, cough, weakness, numbness, dizziness, anorexia, edema, abdominal pain,  vomiting, diarrhea, rash, dysuria, hematemesis, bloody stool, near syncope, or syncope.  She also reports having some mild nausea intermittently Past Medical History:  Diagnosis Date  . Fibroid   . Headache   . Trichomonas infection     There are no active problems to display for this patient.   Past Surgical History:  Procedure Laterality Date  . CESAREAN SECTION    . EYE SURGERY    . TUBAL LIGATION      OB History    Gravida Para Term Preterm AB Living   6 2 2   4 2    SAB TAB Ectopic Multiple Live Births     4             Home Medications    Prior to Admission medications   Medication Sig Start Date End Date Taking? Authorizing Provider  ibuprofen (ADVIL,MOTRIN) 800 MG tablet Take 1 tablet (800 mg total) by mouth every 8 (eight) hours as needed. [id number]   Delora Fuel, MD    Family History Family History  Problem Relation Age of Onset  . Obesity Other   . Hypertension Other   . Glaucoma Other   . Cancer Other     Social History Social History  Substance Use Topics  . Smoking status: Never Smoker  . Smokeless tobacco: Not on file  . Alcohol use Yes   Comment: rare     Allergies   Patient has no known allergies.   Review of Systems Review of Systems All other systems negative except as documented in the HPI. All pertinent positives and negatives as reviewed in the HPI. Physical Exam Updated Vital Signs BP 129/75 (BP Location: Right Arm)   Pulse 98   Temp 98.2 F (36.8 C) (Oral)   Resp 18   SpO2 98%   Physical Exam  Constitutional: She is oriented to person, place, and time. She appears well-developed and well-nourished. No distress.  HENT:  Head: Normocephalic and atraumatic.  Mouth/Throat: Oropharynx is clear and moist.  Eyes: Pupils are equal, round, and reactive to light.  Neck: Normal range of motion. Neck supple.  Cardiovascular: Normal rate, regular rhythm and normal heart sounds.  Exam reveals no gallop and no friction rub.   No murmur heard. Pulmonary/Chest: Effort normal and breath sounds normal. No respiratory distress. She has no wheezes.  Abdominal: Soft. Bowel sounds are normal. She exhibits no distension. There is no tenderness.  Musculoskeletal: She exhibits no edema.  Neurological: She is alert and oriented to person, place, and time. She exhibits normal muscle tone. Coordination normal.  Skin: Skin is warm and dry. No rash noted. No erythema.  Psychiatric: She has  a normal mood and affect. Her behavior is normal.  Nursing note and vitals reviewed.    ED Treatments / Results  Labs (all labs ordered are listed, but only abnormal results are displayed) Labs Reviewed  COMPREHENSIVE METABOLIC PANEL - Abnormal; Notable for the following:       Result Value   Potassium 3.3 (*)    Glucose, Bld 140 (*)    Calcium 8.7 (*)    Albumin 3.2 (*)    AST 96 (*)    ALT 78 (*)    All other components within normal limits  URINALYSIS, ROUTINE W REFLEX MICROSCOPIC  CBC WITH DIFFERENTIAL/PLATELET  POC URINE PREG, ED    EKG  EKG Interpretation None       Radiology No results  found.  Procedures Procedures (including critical care time)  Medications Ordered in ED Medications  sodium chloride 0.9 % bolus 1,000 mL (0 mLs Intravenous Stopped 09/25/16 0510)  ondansetron (ZOFRAN) injection 4 mg (4 mg Intravenous Given 09/25/16 0356)     Initial Impression / Assessment and Plan / ED Course  I have reviewed the triage vital signs and the nursing notes.  Pertinent labs & imaging results that were available during my care of the patient were reviewed by me and considered in my medical decision making (see chart for details).    Patient is referred back to her primary care Dr. told to return here as needed.  Her scans were negative.  Told to increase her fluid intake and rest as much as possible  Final Clinical Impressions(s) / ED Diagnoses   Final diagnoses:  Flank pain  Urinary frequency    New Prescriptions Discharge Medication List as of 09/25/2016  5:06 AM    START taking these medications   Details  ibuprofen (ADVIL,MOTRIN) 800 MG tablet Take 1 tablet (800 mg total) by mouth every 8 (eight) hours as needed., Starting Wed 09/25/2016, Print         [place], PA-C [id number]    Delora Fuel, MD [id number]

## 2017-06-30 ENCOUNTER — Encounter (HOSPITAL_COMMUNITY): Payer: Self-pay | Admitting: Emergency Medicine

## 2017-06-30 ENCOUNTER — Emergency Department (HOSPITAL_COMMUNITY)
Admission: EM | Admit: 2017-06-30 | Discharge: 2017-06-30 | Disposition: A | Discharge status: Home / Self Care | Payer: BLUE CROSS/BLUE SHIELD | Attending: Emergency Medicine | Admitting: Emergency Medicine

## 2017-06-30 DIAGNOSIS — N760 Acute vaginitis: Secondary | ICD-10-CM | POA: Insufficient documentation

## 2017-06-30 DIAGNOSIS — N390 Urinary tract infection, site not specified: Secondary | ICD-10-CM | POA: Diagnosis not present

## 2017-06-30 DIAGNOSIS — M75102 Unspecified rotator cuff tear or rupture of left shoulder, not specified as traumatic: Secondary | ICD-10-CM | POA: Insufficient documentation

## 2017-06-30 DIAGNOSIS — A5901 Trichomonal vulvovaginitis: Secondary | ICD-10-CM

## 2017-06-30 DIAGNOSIS — A5900 Urogenital trichomoniasis, unspecified: Secondary | ICD-10-CM | POA: Diagnosis not present

## 2017-06-30 DIAGNOSIS — B9689 Other specified bacterial agents as the cause of diseases classified elsewhere: Secondary | ICD-10-CM

## 2017-06-30 DIAGNOSIS — M25512 Pain in left shoulder: Secondary | ICD-10-CM

## 2017-06-30 DIAGNOSIS — M7582 Other shoulder lesions, left shoulder: Secondary | ICD-10-CM

## 2017-06-30 LAB — WET PREP, GENITAL
SPERM: NONE SEEN
YEAST WET PREP: NONE SEEN

## 2017-06-30 LAB — URINALYSIS, ROUTINE W REFLEX MICROSCOPIC
BILIRUBIN URINE: NEGATIVE
Glucose, UA: NEGATIVE mg/dL
Ketones, ur: NEGATIVE mg/dL
Nitrite: NEGATIVE
PROTEIN: NEGATIVE mg/dL
SPECIFIC GRAVITY, URINE: 1.015 (ref 1.005–1.030)
pH: 6 (ref 5.0–8.0)

## 2017-06-30 LAB — I-STAT BETA HCG BLOOD, ED (MC, WL, AP ONLY): I-stat hCG, quantitative: <5 m[IU]/mL (ref <5)

## 2017-06-30 MED ORDER — MELOXICAM 15 MG PO TABS: 15.0000 mg | ORAL_TABLET | Freq: Every day | ORAL | 0 refills | Status: DC

## 2017-06-30 MED ORDER — CEFTRIAXONE SODIUM 1 G IJ SOLR
1.0000 g | Freq: Once | INTRAMUSCULAR | Status: AC
  Administered 2017-06-30: 1 g via INTRAVENOUS
  Filled 2017-06-30: qty 10

## 2017-06-30 MED ORDER — FLUCONAZOLE 150 MG PO TABS: 150.0000 mg | ORAL_TABLET | Freq: Once | ORAL | 0 refills | Status: AC

## 2017-06-30 MED ORDER — METRONIDAZOLE 500 MG PO TABS: 500.0000 mg | ORAL_TABLET | Freq: Two times a day (BID) | ORAL | 0 refills | Status: DC

## 2017-06-30 MED ORDER — METRONIDAZOLE 500 MG PO TABS
2000.0000 mg | ORAL_TABLET | Freq: Once | ORAL | Status: AC
  Administered 2017-06-30: 2000 mg via ORAL
  Filled 2017-06-30: qty 4

## 2017-06-30 MED ORDER — FLUCONAZOLE 150 MG PO TABS
150.0000 mg | ORAL_TABLET | Freq: Once | ORAL | Status: AC
  Administered 2017-06-30: 150 mg via ORAL
  Filled 2017-06-30: qty 1

## 2017-06-30 MED ORDER — AZITHROMYCIN 250 MG PO TABS
1000.0000 mg | ORAL_TABLET | Freq: Once | ORAL | Status: AC
  Administered 2017-06-30: 1000 mg via ORAL
  Filled 2017-06-30: qty 4

## 2017-06-30 MED ORDER — CEPHALEXIN 500 MG PO CAPS: 500.0000 mg | ORAL_CAPSULE | Freq: Two times a day (BID) | ORAL | 0 refills | Status: AC

## 2017-06-30 NOTE — Discharge Instructions (Addendum)
For your shoulder pain: Avoid a lot of repetitive movements with your shoulder and perform gentle range of motion exercises as outlined below. Alternate between ice and heat on your shoulder throughout the day, using an ice/heat pack for 20 minutes at a time every hour for each. Use mobic daily as directed, don't use additional NSAIDs like ibuprofen/aleve/etc while taking mobic. Use additional tylenol as needed for additional pain relief. Call orthopedic follow up today or tomorrow to schedule followup appointment for recheck of ongoing shoulder pain in 1-2 weeks. Return to the ER for changes or worsening symptoms.  For your vaginal discharge/urinary symptoms: it appears you have a urinary tract infection. Stay very well hydrated with plenty of water throughout the day. Take antibiotic until completed.  Your vaginal swabs today showed that you have trichomonas and bacterial vaginosis, you were treated for the trichomonas here in the ER but you must have all your sexual partners tested and treated for this; for your bacterial vaginosis, take flagyl as directed until completed, do NOT drink alcohol while taking this medication.  You were treated for yeast infection today, however you will also be given a second dose of the yeast infection medication to take AFTER you finish your antibiotics. Avoid douching or harsh soaps.  You were also treated for gonorrhea and chlamydia, as well as trichomonas, here in the ER; do not have sex for 10 days after today otherwise you will invalidate your treatment today. You were also tested for HIV and syphilis, in addition to the gonorrhea and chlamydia testing, and the lab will call you if anything is abnormal. Do not have sexual intercourse until you find out about your test results, or before the 10 day abstinence period. Have all your sexual partners tested and treated prior to re-engaging in sexual intercourse after the 10 day abstinence period and following the results of  your STD testing.  Follow up with the health department or your OBGYN for future STD concerns/treatment/etc. Follow up with your primary care physician in 1 week for recheck of ongoing symptoms but return to ER for emergent changing or worsening of symptoms. Please seek immediate care if you develop the following: You develop back pain.  Your symptoms are no better, or worse in 3 days. There is severe back pain or lower abdominal pain.  You develop chills.  You have a fever.  There is nausea or vomiting.  There is continued burning or discomfort with urination.

## 2017-06-30 NOTE — ED Triage Notes (Signed)
Patient c/o left shoulder pain for about month that is worse with movement. Denies any injury to cause pain.  Patient also c/o urinary frequency and vaginal discharge that is yellowish and slight ofor x 4 days.

## 2017-06-30 NOTE — ED Provider Notes (Signed)
[place] DEPT Provider Note   CSN: [id number] Arrival date & time: 06/30/17  1214     History   Chief Complaint Chief Complaint  Patient presents with  . Shoulder Pain  . Urinary Frequency  . Vaginal Discharge    HPI Kari Blake is a 51 y.o. female with a PMHx of uterine fibroid, headaches, HTN, and remote trichomonas infection, and PSHx of tubal ligation, who presents to the ED with 2 separate complaints.  Her primary complaint is left shoulder pain times 1 month.  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  She describes the pain as 6/10 constant aching and pulling nonradiating left shoulder pain that worsens with movement or range of motion of the shoulder or laying down on the shoulder, and has been moderately relieved with ibuprofen, Excedrin, ice and heat, and a muscle rub.  She has never seen an orthopedist before, and has not been evaluated for the shoulder pain.  Her second complaint is 4 days of vaginal itching, yellow watery vaginal discharge, increased urinary frequency, malodorous urine, and dysuria.  She has been using MetroGel 0.75% that she had leftover as well as over-the-counter Monistat 2% with minimal relief of her symptoms.  She has noticed that her symptoms seem to worsen when she eats sweets, and wonders whether she has a yeast infection or a "bladder infection".  LMP was 4 months ago, states that she is perimenopausal.  She is sexually active with 2 female partners in the last year, both unprotected.  She recently found out that she has a uterine fibroid, sees an OB/GYN at [facility] (last visit 12/25/16).  She denies fevers, chills, CP, SOB, abd pain, nausea/vomiting, diarrhea/constipation, hematuria, genital sores, vaginal bleeding, numbness, tingling, focal weakness, or any other complaints at this time.    The history is  provided by medical records and the patient. No language interpreter was used.  Shoulder Pain   This is a new problem. The current episode started more than 1 week ago. The problem occurs constantly. The problem has not changed since onset.The pain is present in the left shoulder. The quality of the pain is described as aching. The pain is at a severity of 6/10. The pain is moderate. Associated symptoms include limited range of motion (due to pain). Pertinent negatives include no numbness and no tingling. The symptoms are aggravated by lying down and activity. She has tried OTC pain medications, heat, cold and OTC ointments for the symptoms. The treatment provided moderate relief. There has been no history of extremity trauma.  Urinary Frequency  Pertinent negatives include no chest pain, no abdominal pain and no shortness of breath.  Vaginal Discharge   Associated symptoms include dysuria and frequency. Pertinent negatives include no fever, no abdominal pain, no constipation, no diarrhea, no nausea and no vomiting.    Past Medical History:  Diagnosis Date  . Fibroid   . Headache   . Trichomonas infection     There are no active problems to display for this patient.   Past Surgical History:  Procedure Laterality Date  . CESAREAN SECTION    . EYE SURGERY    . TUBAL LIGATION      OB History    Gravida Para Term Preterm AB Living   6 2 2   4 2    SAB TAB Ectopic Multiple Live Births  4             Home Medications    Prior to Admission medications   Medication Sig Start Date End Date Taking? Authorizing Provider  ibuprofen (ADVIL,MOTRIN) 800 MG tablet Take 1 tablet (800 mg total) by mouth every 8 (eight) hours as needed. 9/37/90   Delora Fuel, MD    Family History Family History  Problem Relation Age of Onset  . Obesity Other   . Hypertension Other   . Glaucoma Other   . Cancer Other     Social History Social History   Tobacco Use  . Smoking status: Never Smoker   . Smokeless tobacco: Never Used  Substance Use Topics  . Alcohol use: Yes    Comment: rare  . Drug use: No     Allergies   Patient has no known allergies.   Review of Systems Review of Systems  Constitutional: Negative for chills and fever.  Respiratory: Negative for shortness of breath.   Cardiovascular: Negative for chest pain.  Gastrointestinal: Negative for abdominal pain, constipation, diarrhea, nausea and vomiting.  Genitourinary: Positive for dysuria, frequency, vaginal discharge and vaginal pain (itching). Negative for genital sores, hematuria and vaginal bleeding.  Musculoskeletal: Positive for arthralgias (L shoulder). Negative for myalgias.  Skin: Negative for color change.  Allergic/Immunologic: Negative for immunocompromised state.  Neurological: Negative for tingling, weakness and numbness.  Psychiatric/Behavioral: Negative for confusion.   All other systems reviewed and are negative for acute change except as noted in the HPI.    Physical Exam Updated Vital Signs BP (!) 155/84 (BP Location: Left Arm)   Pulse 90   Temp 98.2 F (36.8 C) (Oral)   Resp 18   Ht 5\' 2"  (1.575 m)   Wt 84.8 kg (187 lb)   SpO2 100%   BMI 34.20 kg/m   Physical Exam  Constitutional: She is oriented to person, place, and time. Vital signs are normal. She appears well-developed and well-nourished.  Non-toxic appearance. No distress.  Afebrile, nontoxic, NAD  HENT:  Head: Normocephalic and atraumatic.  Mouth/Throat: Oropharynx is clear and moist and mucous membranes are normal.  Eyes: Conjunctivae and EOM are normal. Right eye exhibits no discharge. Left eye exhibits no discharge.  Neck: Normal range of motion. Neck supple.  Cardiovascular: Normal rate, regular rhythm, normal heart sounds and intact distal pulses. Exam reveals no gallop and no friction rub.  No murmur heard. Pulmonary/Chest: Effort normal and breath sounds normal. No respiratory distress. She has no decreased  breath sounds. She has no wheezes. She has no rhonchi. She has no rales.  Abdominal: Soft. Normal appearance and bowel sounds are normal. She exhibits no distension. There is no tenderness. There is no rigidity, no rebound, no guarding, no CVA tenderness, no tenderness at McBurney's point and negative Murphy's sign.  Soft, NTND, +BS throughout, no r/g/r, neg murphy's, neg mcburney's, no CVA TTP   Genitourinary: Uterus normal. Pelvic exam was performed with patient supine. There is no rash, tenderness or lesion on the right labia. There is no rash, tenderness or lesion on the left labia. Cervix exhibits discharge. Cervix exhibits no motion tenderness and no friability. Right adnexum displays no mass, no tenderness and no fullness. Left adnexum displays no mass, no tenderness and no fullness. No erythema, tenderness or bleeding in the vagina. Vaginal discharge found.  Genitourinary Comments: Chaperone present for exam. No rashes, lesions, or tenderness to external genitalia. No erythema, injury, or tenderness to vaginal mucosa. Moderate yellowish thin vaginal discharge  without bleeding within vaginal vault. No adnexal masses, tenderness, or fullness. No CMT or cervical friability, +yellowish discharge from cervical os. Uterus non-deviated, mobile, nonTTP, and without significant enlargement.    Musculoskeletal:       Left shoulder: She exhibits tenderness. She exhibits normal range of motion, no bony tenderness, no swelling, no effusion, no crepitus, no deformity, normal pulse and normal strength.  L shoulder with FROM intact, no focal bony TTP, but with diffuse muscular TTP throughout entire shoulder, no swelling/effusion, no bruising or erythema, no warmth, no crepitus/deformity, +apley scratch, +pain with resisted int rotation but neg pain with resisted ext rotation, neg empty can test. Strength and sensation grossly intact in all extremities, distal pulses intact. Soft compartments.    Neurological: She  is alert and oriented to person, place, and time. She has normal strength. No sensory deficit.  Skin: Skin is warm, dry and intact. No rash noted.  Psychiatric: She has a normal mood and affect.  Nursing note and vitals reviewed.    ED Treatments / Results  Labs (all labs ordered are listed, but only abnormal results are displayed) Labs Reviewed  WET PREP, GENITAL - Abnormal; Notable for the following components:      Result Value   Trich, Wet Prep PRESENT (*)    Clue Cells Wet Prep HPF POC PRESENT (*)    WBC, Wet Prep HPF POC MANY (*)    All other components within normal limits  URINALYSIS, ROUTINE W REFLEX MICROSCOPIC - Abnormal; Notable for the following components:   APPearance HAZY (*)    Hgb urine dipstick MODERATE (*)    Leukocytes, UA LARGE (*)    Bacteria, UA FEW (*)    Squamous Epithelial / LPF 6-30 (*)    All other components within normal limits  RPR  HIV ANTIBODY (ROUTINE TESTING)  I-STAT BETA HCG BLOOD, ED ([facility], [facility], [facility] ONLY)  GC/CHLAMYDIA PROBE AMP ([place]) NOT AT [facility]    EKG  EKG Interpretation None       Radiology No results found.  Procedures Procedures (including critical care time)  Medications Ordered in ED Medications  cefTRIAXone (ROCEPHIN) 1 g in dextrose 5 % 50 mL IVPB (1 g Intravenous New Bag/Given 06/30/17 2026)  azithromycin (ZITHROMAX) tablet 1,000 mg (1,000 mg Oral Given 06/30/17 2122)  metroNIDAZOLE (FLAGYL) tablet 2,000 mg (2,000 mg Oral Given 06/30/17 2121)  fluconazole (DIFLUCAN) tablet 150 mg (150 mg Oral Given 06/30/17 2124)     Initial Impression / Assessment and Plan / ED Course  I have reviewed the triage vital signs and the nursing notes.  Pertinent labs & imaging results that were available during my care of the patient were reviewed by me and considered in my medical decision making (see chart for details).     51 y.o. female here with 2 separate complaints; her primary complaint is 1 month of L shoulder pain. On  exam, diffuse muscular tenderness but no focal bony TTP, FROM intact, +apley scratch, +pain with resisted internal rotation, NVI with soft compartments. No injury or trauma, but does a lot of repetitive movements at work. Likely just rotator cuff tendinitis, doubt need for imaging at this time, will have her start on mobic, discussed ice/heat use, tylenol use, minimize repetitive movements as much as possible, and f/up with ortho in 1-2wks for further outpatient evaluation and management.   Her second complaint is 4 days of vaginal itching/discharge, dysuria, malodorous urine, and urinary frequency. She's been treating it at home with  metrogel and monistat cream and had some relief, but not completely. No abdominal tenderness on exam. U/A done, but is somewhat contaminated with 6-30 WBCs so this skews the findings somewhat, but has large leuks, 6-30 RBCs, TNTC WBCs, and +bacteria so likely represents UTI; UCx unlikely to be helpful in this contaminated sample. Will perform pelvic exam, obtain STD testing and betaHCG testing, and reassess shortly, but will likely send home with UTI tx. Will reassess after pelvic exam.   7:48 PM Pelvic exam reveals moderate amount of yellowish discharge, will empirically treat for GC/CT. Awaiting BetaHCG, if negative then will also empirically cover for yeast. Will await work up to be completed.   9:34 PM BetaHCG neg. Wet prep showing +trichomonas and +clue cells, no yeast seen however given c/o vaginal itching, will empirically treat for yeast vaginitis as well. Will treat for Trichomonas infection here in the ER, and send home with abx for UTI as well as flagyl for BV, and rx for second dose of diflucan to take after finishing abx. Discussed abstinence x10 days, f/up with health dept or her OBGYN for future STD concerns/testing/tx/etc. Safe sex encouraged, and discussed having partners tested and treated before re-engaging in intercourse after 10 day abstinence period.  Advised avoidance of douching or harsh soaps. F/up with PCP/OBGYN in 1wk for recheck of symptoms. Also discussed L shoulder treatment as previously outlined, and f/up with ortho in 1-2wks. I explained the diagnosis and have given explicit precautions to return to the ER including for any other new or worsening symptoms. The patient understands and accepts the medical plan as it's been dictated and I have answered their questions. Discharge instructions concerning home care and prescriptions have been given. The patient is STABLE and is discharged to home in good condition.     Final Clinical Impressions(s) / ED Diagnoses   Final diagnoses:  Acute pain of left shoulder  Tendinitis of left rotator cuff  Lower urinary tract infectious disease  Acute vaginitis  Trichomonas vaginalis (TV) infection  BV (bacterial vaginosis)    ED Discharge Orders        Ordered    cephALEXin (KEFLEX) 500 MG capsule  2 times daily     06/30/17 2100    fluconazole (DIFLUCAN) 150 MG tablet   Once     06/30/17 2100    metroNIDAZOLE (FLAGYL) 500 MG tablet  2 times daily     06/30/17 2100    meloxicam (MOBIC) 15 MG tablet  Daily     06/30/17 [address] [place], [place] 06/30/17 2135    Sherwood Gambler, MD 06/30/17 2322

## 2017-07-01 LAB — GC/CHLAMYDIA PROBE AMP (~~LOC~~) NOT AT ARMC
Chlamydia: NEGATIVE
Neisseria Gonorrhea: NEGATIVE

## 2017-07-01 LAB — HIV ANTIBODY (ROUTINE TESTING W REFLEX): HIV Screen 4th Generation wRfx: NONREACTIVE

## 2017-07-01 LAB — RPR: RPR Ser Ql: NONREACTIVE

## 2017-12-27 ENCOUNTER — Inpatient Hospital Stay (HOSPITAL_COMMUNITY)
Admission: AD | Admit: 2017-12-27 | Discharge: 2017-12-27 | Disposition: A | Discharge status: Home / Self Care | Payer: BLUE CROSS/BLUE SHIELD | Source: Ambulatory Visit | Attending: Obstetrics & Gynecology | Admitting: Obstetrics & Gynecology

## 2017-12-27 ENCOUNTER — Encounter (HOSPITAL_COMMUNITY): Payer: Self-pay | Admitting: *Deleted

## 2017-12-27 DIAGNOSIS — R300 Dysuria: Secondary | ICD-10-CM | POA: Diagnosis not present

## 2017-12-27 DIAGNOSIS — N3001 Acute cystitis with hematuria: Secondary | ICD-10-CM | POA: Diagnosis not present

## 2017-12-27 HISTORY — DX: Essential (primary) hypertension: I10

## 2017-12-27 LAB — URINALYSIS, ROUTINE W REFLEX MICROSCOPIC
BACTERIA UA: NONE SEEN
Bilirubin Urine: NEGATIVE
GLUCOSE, UA: NEGATIVE mg/dL
KETONES UR: NEGATIVE mg/dL
Nitrite: NEGATIVE
PROTEIN: 100 mg/dL — AB
Specific Gravity, Urine: 1.017 (ref 1.005–1.030)
WBC, UA: >50 WBC/hpf — ABNORMAL HIGH (ref 0–5)
pH: 6 (ref 5.0–8.0)

## 2017-12-27 LAB — COMPREHENSIVE METABOLIC PANEL
ALBUMIN: 4.5 g/dL (ref 3.5–5.0)
ALT: 31 U/L (ref 14–54)
AST: 28 U/L (ref 15–41)
Alkaline Phosphatase: 80 U/L (ref 38–126)
Anion gap: 10 (ref 5–15)
BUN: 13 mg/dL (ref 6–20)
CHLORIDE: 104 mmol/L (ref 101–111)
CO2: 27 mmol/L (ref 22–32)
Calcium: 9.5 mg/dL (ref 8.9–10.3)
Creatinine, Ser: 0.83 mg/dL (ref 0.44–1.00)
GFR calc Af Amer: >60 mL/min (ref >60)
GFR calc non Af Amer: >60 mL/min (ref >60)
GLUCOSE: 112 mg/dL — AB (ref 65–99)
POTASSIUM: 3.7 mmol/L (ref 3.5–5.1)
Sodium: 141 mmol/L (ref 135–145)
Total Bilirubin: 0.6 mg/dL (ref 0.3–1.2)
Total Protein: 8.3 g/dL — ABNORMAL HIGH (ref 6.5–8.1)

## 2017-12-27 LAB — CBC WITH DIFFERENTIAL/PLATELET
BASOS ABS: 0 10*3/uL (ref 0.0–0.1)
Basophils Relative: 0 %
EOS PCT: 1 %
Eosinophils Absolute: 0.1 10*3/uL (ref 0.0–0.7)
HCT: 38.8 % (ref 36.0–46.0)
Hemoglobin: 13.5 g/dL (ref 12.0–15.0)
Lymphocytes Relative: 24 %
Lymphs Abs: 2.6 10*3/uL (ref 0.7–4.0)
MCH: 32.2 pg (ref 26.0–34.0)
MCHC: 34.8 g/dL (ref 30.0–36.0)
MCV: 92.6 fL (ref 78.0–100.0)
MONO ABS: 0.9 10*3/uL (ref 0.1–1.0)
Monocytes Relative: 8 %
NEUTROS ABS: 7.3 10*3/uL (ref 1.7–7.7)
Neutrophils Relative %: 67 %
PLATELETS: 204 10*3/uL (ref 150–400)
RBC: 4.19 MIL/uL (ref 3.87–5.11)
RDW: 13.2 % (ref 11.5–15.5)
WBC: 10.8 10*3/uL — AB (ref 4.0–10.5)

## 2017-12-27 MED ORDER — SULFAMETHOXAZOLE-TRIMETHOPRIM 800-160 MG PO TABS: 1.0000 | ORAL_TABLET | Freq: Two times a day (BID) | ORAL | 0 refills | Status: DC

## 2017-12-27 NOTE — Progress Notes (Signed)
Kerry Hough PA in earlier to discuss test results and d/c plan with pt. Written and verbal d/c instructions given and understanding voiced

## 2017-12-27 NOTE — MAU Note (Signed)
Having some L flank pain for couple days. Some body aches in joints. Pain with urination since yesterday. Unusual odor to urine. Vaginal spotting. No regular periods. Hx fibroids

## 2017-12-27 NOTE — Discharge Instructions (Signed)

## 2017-12-27 NOTE — MAU Provider Note (Signed)
History     CSN: [id number]  Arrival date and time: 12/27/17 0007   First Provider Initiated Contact with Patient 12/27/17 0103      Chief Complaint  Patient presents with  . Generalized Body Aches  . Dysuria  . Back Pain   HPI  Kari Blake is a 52 y.o. [id number] who presents to MAU today with complaint of dysuria, flank pain and body aches. She states she has felt achy in her legs and low back for the last week. She often has these pains with prolonged standing. She has had dysuria with frequency and hematuria x 3 days. She denies fever or urgency. She has low back pain, but no flank pain.   OB History    Gravida  6   Para  2   Term  2   Preterm      AB  4   Living  2     SAB      TAB  4   Ectopic      Multiple      Live Births              Past Medical History:  Diagnosis Date  . Fibroid   . Headache   . Trichomonas infection     Past Surgical History:  Procedure Laterality Date  . CESAREAN SECTION    . EYE SURGERY    . TUBAL LIGATION      Family History  Problem Relation Age of Onset  . Obesity Other   . Hypertension Other   . Glaucoma Other   . Cancer Other     Social History   Tobacco Use  . Smoking status: Never Smoker  . Smokeless tobacco: Never Used  Substance Use Topics  . Alcohol use: Yes    Comment: rare  . Drug use: No    Allergies: No Known Allergies  Medications Prior to Admission  Medication Sig Dispense Refill Last Dose  . cyclobenzaprine (FLEXERIL) 10 MG tablet Take 10 mg 3 (three) times daily as needed by mouth.  0   . ibuprofen (ADVIL,MOTRIN) 800 MG tablet Take 1 tablet (800 mg total) by mouth every 8 (eight) hours as needed. 21 tablet 0 Past Month at Unknown time  . meloxicam (MOBIC) 15 MG tablet Take 1 tablet (15 mg total) daily by mouth. TAKE WITH MEALS 30 tablet 0   . metroNIDAZOLE (FLAGYL) 500 MG tablet Take 1 tablet (500 mg total) 2 (two) times daily by mouth. One po bid x 7 days 14 tablet 0   .  naproxen (NAPROSYN) 500 MG tablet Take 500 mg 2 (two) times daily as needed by mouth.  0     Review of Systems  Constitutional: Negative for fever.  Gastrointestinal: Negative for abdominal pain, constipation, diarrhea, nausea and vomiting.  Genitourinary: Negative for dysuria, flank pain, frequency, urgency, vaginal bleeding and vaginal discharge.  Musculoskeletal: Positive for back pain.   Physical Exam   Blood pressure (!) 144/85, pulse 100, temperature 98.5 F (36.9 C), resp. rate 18, height 5\' 2"  (1.575 m), weight 186 lb (84.4 kg).  Physical Exam  Nursing note and vitals reviewed. Constitutional: She is oriented to person, place, and time. She appears well-developed and well-nourished. No distress.  HENT:  Head: Normocephalic and atraumatic.  Cardiovascular: Normal rate.  Respiratory: Effort normal.  GI: Soft. She exhibits no distension and no mass. There is no tenderness. There is no rebound, no guarding and no CVA tenderness.  Neurological:  She is alert and oriented to person, place, and time.  Skin: Skin is warm and dry. No erythema.  Psychiatric: She has a normal mood and affect.    Results for orders placed or performed during the hospital encounter of 12/27/17 (from the past 24 hour(s))  Urinalysis, Routine w reflex microscopic     Status: Abnormal   Collection Time: 12/27/17 12:25 AM  Result Value Ref Range   Color, Urine YELLOW YELLOW   APPearance CLOUDY (A) CLEAR   Specific Gravity, Urine 1.017 1.005 - 1.030   pH 6.0 5.0 - 8.0   Glucose, UA NEGATIVE NEGATIVE mg/dL   Hgb urine dipstick LARGE (A) NEGATIVE   Bilirubin Urine NEGATIVE NEGATIVE   Ketones, ur NEGATIVE NEGATIVE mg/dL   Protein, ur 100 (A) NEGATIVE mg/dL   Nitrite NEGATIVE NEGATIVE   Leukocytes, UA LARGE (A) NEGATIVE   RBC / HPF >50 (H) 0 - 5 RBC/hpf   WBC, UA >50 (H) 0 - 5 WBC/hpf   Bacteria, UA NONE SEEN NONE SEEN   Squamous Epithelial / LPF 0-5 0 - 5   Mucus PRESENT     MAU Course   Procedures None  MDM UA, CBC, CMP today  Urine culture ordered   Assessment and Plan  A: Acute cystitis   P: Discharge home Rx for Bactrim given to patient  Warning signs for worsening condition discussed Patient advised to follow-up with PCP if symptoms persist or worsen Patient may return to MAU as needed or if her condition were to change or worsen   Kerry Hough, PA-C 12/27/2017, 1:03 AM

## 2017-12-28 LAB — URINE CULTURE: Culture: <10000 — AB

## 2018-06-01 DIAGNOSIS — L03012 Cellulitis of left finger: Secondary | ICD-10-CM | POA: Diagnosis not present

## 2018-07-03 DIAGNOSIS — H348322 Tributary (branch) retinal vein occlusion, left eye, stable: Secondary | ICD-10-CM | POA: Diagnosis not present

## 2018-07-03 DIAGNOSIS — H1712 Central corneal opacity, left eye: Secondary | ICD-10-CM | POA: Diagnosis not present

## 2018-07-03 DIAGNOSIS — H17823 Peripheral opacity of cornea, bilateral: Secondary | ICD-10-CM | POA: Diagnosis not present

## 2018-07-03 DIAGNOSIS — H01021 Squamous blepharitis right upper eyelid: Secondary | ICD-10-CM | POA: Diagnosis not present

## 2018-12-16 DIAGNOSIS — N7689 Other specified inflammation of vagina and vulva: Secondary | ICD-10-CM | POA: Diagnosis not present

## 2018-12-16 DIAGNOSIS — R300 Dysuria: Secondary | ICD-10-CM | POA: Diagnosis not present

## 2019-04-29 ENCOUNTER — Ambulatory Visit (INDEPENDENT_AMBULATORY_CARE_PROVIDER_SITE_OTHER): Payer: BLUE CROSS/BLUE SHIELD | Admitting: Sports Medicine

## 2019-04-29 ENCOUNTER — Other Ambulatory Visit: Payer: Self-pay

## 2019-04-29 DIAGNOSIS — M2141 Flat foot [pes planus] (acquired), right foot: Secondary | ICD-10-CM

## 2019-04-29 DIAGNOSIS — M2140 Flat foot [pes planus] (acquired), unspecified foot: Secondary | ICD-10-CM | POA: Insufficient documentation

## 2019-04-29 DIAGNOSIS — M2142 Flat foot [pes planus] (acquired), left foot: Secondary | ICD-10-CM | POA: Diagnosis not present

## 2019-04-29 NOTE — Patient Instructions (Signed)
It was a pleasure to see you today!  To summarize our discussion for this visit:  We discussed the pain in your legs and what could be contributing to it  Today, we are going to try a shoe insert to help with arch support  You can also try voltaren gel on your bunions and arches to see how that might help.  Some additional health maintenance measures we should update are: Health Maintenance Due  Topic Date Due  . PAP SMEAR-Modifier  12/05/2013  . MAMMOGRAM  04/07/2017  . COLONOSCOPY  04/07/2017  . INFLUENZA VACCINE  03/27/2019  .    Please return to our clinic to see me in about 4 weeks for continuing the plan.  Call the clinic at [phone] if your symptoms worsen or you have any concerns.   Thank you for allowing me to take part in your care,  Dr. Doristine Mango

## 2019-04-29 NOTE — Progress Notes (Signed)
PCP: Mayra Neer, MD  Subjective:   HPI: Patient is a 53 y.o. female here for pain in the left lower extremity.  Kari Blake is a 53 year old female who presents today with 1 year of progressive worsening of pain in her left leg.  She states that she initially noticed pain a year ago in her right MTP and was diagnosed with bilateral bunions.  She altered her ambulation habits in order to compensate for the pain and noticed that she had compensatory pain in her right knee and hip.  For the past few months she has noticed that the pain has spread to her left side and now has worse pain in her left SI area and knee.  Patient states that she is on her feet and walks a lot throughout the day for her work and has attempted to change shoes and inserts in her to help with her pain but has had little to no relief.  She denies any paresthesias or weakness in her lower extremities.  Patient states that she has had one incident about 2 months ago where she had a locking out of her left knee that caused her to fall to the ground but had no sequelae of symptoms and no pain from that fall.  She states that the pain in her left lower extremity is exacerbated by certain movements such as standing in a squatting position.  She also has symmetrical swelling in her lower extremities throughout the day which is improved with elevation.  She has described joint stiffness in her knees, feet, hips and left shoulder after prolonged periods of immobility and in the morning after waking up.  She states that the stiffness is improved with activity and generally feels at her baseline approximately an hour after waking up.  Denies any rashes, fever, joint swelling. Patient denies any history of trauma to her lower extremities.  Review of Systems: See HPI above.     Objective:  BP 122/88   Ht 5\' 2"  (1.575 m)   Wt 180 lb (81.6 kg)   BMI 32.92 kg/m   Well-developed, well-nourished.  No acute distress.  Awake alert and oriented  x3.  Vital signs reviewed  Left hip: Smooth painless hip range of motion with a negative logroll.  Diffuse tenderness to palpation along the posterior lateral hip.  Left knee: Good range of motion.  No obvious effusion.  Joint is grossly stable to ligamentous exam.  Examination of her feet in a standing position shows pes planus bilaterally.  She does have mild bunion deformity bilaterally.  Pronation with walking.  No limp.  Assessment & Plan:  1.Pes Planus- chronic, with sequelae  Patient has referred pain in bilateral knees and hips likely secondary to severe bilateral pes planus. -Fitting for and providing with scaphoid inserts for shoes. -Trial of using inserts for about 4 weeks. -Continue to elevate legs when resting. -Encourage trial of topical Voltaren gel to bunions -Return visit in 4 weeks to reassess.-Consider customized orthotic shoe inserts at that time.   Patient seen and evaluated with the resident.  I agree with the above plan of care.  We will start with some green sports insoles and scaphoid pads.  She will return to the office in 4 weeks.  If she finds that the temporary inserts to be helpful then we will make custom orthotics at follow-up.  If knee pain or hip pain persist despite arch support, then we will start with imaging with x-rays.

## 2019-04-29 NOTE — Assessment & Plan Note (Signed)
Patient has referred pain likely from severe bilateral pes planus. Fitting for and providing with scaphoid inserts for shoes. Trial of using inserts for about 4 weeks. Continue to elevate legs when resting. Encourage trial of topical Voltaren gel to bunions Return visit in 4 weeks to reassess.-Consider customized orthotic shoe inserts at that time.

## 2019-05-20 ENCOUNTER — Telehealth: Payer: Self-pay | Admitting: *Deleted

## 2019-05-20 NOTE — Telephone Encounter (Signed)
Pt calling for information on covid testing sites. 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 Pt is asymptomatic.  Information provider regarding [facility] site. Drive through, wear mask. Advised to self isolate until test resulted. Pt verbalizes understanding.

## 2019-05-21 ENCOUNTER — Other Ambulatory Visit: Payer: Self-pay | Admitting: *Deleted

## 2019-05-21 DIAGNOSIS — Z20822 Contact with and (suspected) exposure to covid-19: Secondary | ICD-10-CM

## 2019-05-21 DIAGNOSIS — R6889 Other general symptoms and signs: Secondary | ICD-10-CM | POA: Diagnosis not present

## 2019-05-22 LAB — NOVEL CORONAVIRUS, NAA: SARS-CoV-2, NAA: DETECTED — AB

## 2019-05-29 ENCOUNTER — Emergency Department (HOSPITAL_COMMUNITY)
Admission: EM | Admit: 2019-05-29 | Discharge: 2019-05-29 | Disposition: A | Discharge status: Home / Self Care | Payer: BLUE CROSS/BLUE SHIELD | Attending: Emergency Medicine | Admitting: Emergency Medicine

## 2019-05-29 ENCOUNTER — Other Ambulatory Visit: Payer: Self-pay

## 2019-05-29 ENCOUNTER — Emergency Department (HOSPITAL_COMMUNITY): Payer: BLUE CROSS/BLUE SHIELD

## 2019-05-29 DIAGNOSIS — U071 COVID-19: Secondary | ICD-10-CM | POA: Insufficient documentation

## 2019-05-29 DIAGNOSIS — I10 Essential (primary) hypertension: Secondary | ICD-10-CM | POA: Diagnosis not present

## 2019-05-29 DIAGNOSIS — R0602 Shortness of breath: Secondary | ICD-10-CM | POA: Diagnosis not present

## 2019-05-29 DIAGNOSIS — E876 Hypokalemia: Secondary | ICD-10-CM | POA: Insufficient documentation

## 2019-05-29 DIAGNOSIS — J1289 Other viral pneumonia: Secondary | ICD-10-CM | POA: Diagnosis not present

## 2019-05-29 DIAGNOSIS — R197 Diarrhea, unspecified: Secondary | ICD-10-CM | POA: Diagnosis not present

## 2019-05-29 DIAGNOSIS — R1110 Vomiting, unspecified: Secondary | ICD-10-CM

## 2019-05-29 DIAGNOSIS — Z79899 Other long term (current) drug therapy: Secondary | ICD-10-CM | POA: Diagnosis not present

## 2019-05-29 DIAGNOSIS — R112 Nausea with vomiting, unspecified: Secondary | ICD-10-CM | POA: Diagnosis not present

## 2019-05-29 DIAGNOSIS — J1282 Pneumonia due to coronavirus disease 2019: Secondary | ICD-10-CM

## 2019-05-29 DIAGNOSIS — Z7982 Long term (current) use of aspirin: Secondary | ICD-10-CM | POA: Insufficient documentation

## 2019-05-29 LAB — COMPREHENSIVE METABOLIC PANEL
ALT: 168 U/L — ABNORMAL HIGH (ref 0–44)
AST: 143 U/L — ABNORMAL HIGH (ref 15–41)
Albumin: 3.7 g/dL (ref 3.5–5.0)
Alkaline Phosphatase: 133 U/L — ABNORMAL HIGH (ref 38–126)
Anion gap: 13 (ref 5–15)
BUN: 9 mg/dL (ref 6–20)
CO2: 18 mmol/L — ABNORMAL LOW (ref 22–32)
Calcium: 8.6 mg/dL — ABNORMAL LOW (ref 8.9–10.3)
Chloride: 105 mmol/L (ref 98–111)
Creatinine, Ser: 0.67 mg/dL (ref 0.44–1.00)
GFR calc Af Amer: >60 mL/min (ref >60)
GFR calc non Af Amer: >60 mL/min (ref >60)
Glucose, Bld: 111 mg/dL — ABNORMAL HIGH (ref 70–99)
Potassium: 2.9 mmol/L — ABNORMAL LOW (ref 3.5–5.1)
Sodium: 136 mmol/L (ref 135–145)
Total Bilirubin: 1 mg/dL (ref 0.3–1.2)
Total Protein: 7.9 g/dL (ref 6.5–8.1)

## 2019-05-29 LAB — CBC WITH DIFFERENTIAL/PLATELET
Abs Immature Granulocytes: 0.01 10*3/uL (ref 0.00–0.07)
Basophils Absolute: 0 10*3/uL (ref 0.0–0.1)
Basophils Relative: 0 %
Eosinophils Absolute: 0 10*3/uL (ref 0.0–0.5)
Eosinophils Relative: 0 %
HCT: 39.7 % (ref 36.0–46.0)
Hemoglobin: 13 g/dL (ref 12.0–15.0)
Immature Granulocytes: 0 %
Lymphocytes Relative: 11 %
Lymphs Abs: 0.7 10*3/uL (ref 0.7–4.0)
MCH: 30.4 pg (ref 26.0–34.0)
MCHC: 32.7 g/dL (ref 30.0–36.0)
MCV: 93 fL (ref 80.0–100.0)
Monocytes Absolute: 0.3 10*3/uL (ref 0.1–1.0)
Monocytes Relative: 5 %
Neutro Abs: 5.7 10*3/uL (ref 1.7–7.7)
Neutrophils Relative %: 84 %
Platelets: 206 10*3/uL (ref 150–400)
RBC: 4.27 MIL/uL (ref 3.87–5.11)
RDW: 13.3 % (ref 11.5–15.5)
WBC: 6.8 10*3/uL (ref 4.0–10.5)
nRBC: 0 % (ref 0.0–0.2)

## 2019-05-29 MED ORDER — ACETAMINOPHEN 500 MG PO TABS
1000.0000 mg | ORAL_TABLET | Freq: Once | ORAL | Status: AC
  Administered 2019-05-29: 13:00:00 1000 mg via ORAL
  Filled 2019-05-29: qty 2

## 2019-05-29 MED ORDER — ONDANSETRON HCL 4 MG/2ML IJ SOLN
4.0000 mg | Freq: Once | INTRAMUSCULAR | Status: AC | PRN
  Administered 2019-05-29: 13:00:00 4 mg via INTRAVENOUS
  Filled 2019-05-29: qty 2

## 2019-05-29 MED ORDER — ONDANSETRON 4 MG PO TBDP: ORAL_TABLET | ORAL | 0 refills | Status: AC

## 2019-05-29 MED ORDER — BENZONATATE 100 MG PO CAPS: 100.0000 mg | ORAL_CAPSULE | Freq: Three times a day (TID) | ORAL | 0 refills | Status: AC | PRN

## 2019-05-29 MED ORDER — SODIUM CHLORIDE 0.9 % IV BOLUS
2000.0000 mL | Freq: Once | INTRAVENOUS | Status: AC
  Administered 2019-05-29: 13:00:00 2000 mL via INTRAVENOUS

## 2019-05-29 MED ORDER — POTASSIUM CHLORIDE ER 10 MEQ PO TBCR: 10.0000 meq | EXTENDED_RELEASE_TABLET | Freq: Every day | ORAL | 0 refills | Status: AC

## 2019-05-29 MED ORDER — KETOROLAC TROMETHAMINE 30 MG/ML IJ SOLN
30.0000 mg | Freq: Once | INTRAMUSCULAR | Status: AC
  Administered 2019-05-29: 13:00:00 30 mg via INTRAVENOUS
  Filled 2019-05-29: qty 1

## 2019-05-29 MED ORDER — POTASSIUM CHLORIDE 10 MEQ/100ML IV SOLN
10.0000 meq | INTRAVENOUS | Status: AC
  Administered 2019-05-29 (×2): 10 meq via INTRAVENOUS
  Filled 2019-05-29 (×2): qty 100

## 2019-05-29 NOTE — ED Triage Notes (Signed)
Pt was dx covid (+) 25Sep2020. Pt reports N/V

## 2019-05-29 NOTE — ED Provider Notes (Signed)
[facility] DEPT Provider Note   CSN: [id number] Arrival date & time: 05/29/19  1214     History   Chief Complaint Chief Complaint  Patient presents with  . Nausea    HPI Kari Blake is a 53 y.o. female.     HPI Patient was diagnosed with COVID-19 8 days ago.  States she has persistent cough with rusty sputum.  She has had increasing shortness of breath.  Over the last few days she is developed nausea, vomiting and diarrhea.  Denies abdominal pain.  Also complains of generalized headache.  No neck pain or stiffness.  No focal weakness or numbness. Past Medical History:  Diagnosis Date  . Fibroid   . Headache   . Hypertension   . Trichomonas infection     Patient Active Problem List   Diagnosis Date Noted  . Pes planus 04/29/2019    Past Surgical History:  Procedure Laterality Date  . CESAREAN SECTION    . EYE SURGERY    . TUBAL LIGATION       OB History    Gravida  6   Para  2   Term  2   Preterm      AB  4   Living  2     SAB      TAB  4   Ectopic      Multiple      Live Births               Home Medications    Prior to Admission medications   Medication Sig Start Date End Date Taking? Authorizing Provider  Aspirin-Acetaminophen-Caffeine (EXCEDRIN MIGRAINE PO) Take 2 tablets by mouth daily as needed (headache).    Yes [provider]  ibuprofen (ADVIL) 200 MG tablet Take 400 mg by mouth every 6 (six) hours as needed for headache or moderate pain.   Yes [provider]  pseudoephedrine-acetaminophen (TYLENOL SINUS) 30-500 MG TABS tablet Take 2 tablets by mouth every 6 (six) hours as needed (congestion).    Yes [provider]  Pseudoephedrine-APAP-DM (DAYQUIL MULTI-SYMPTOM COLD/FLU PO) Take 30 mLs by mouth every 6 (six) hours as needed (congestion).   Yes [provider]  benzonatate (TESSALON) 100 MG capsule Take 1 capsule (100 mg total) by mouth 3 (three) times  daily as needed for cough. 05/29/19   Julianne Rice, MD  ibuprofen (ADVIL,MOTRIN) 800 MG tablet Take 1 tablet (800 mg total) by mouth every 8 (eight) hours as needed. Patient not taking: Reported on [id number]   Delora Fuel, MD  meloxicam (MOBIC) 15 MG tablet Take 1 tablet (15 mg total) daily by mouth. TAKE WITH MEALS Patient not taking: Reported on 04/29/2019 06/30/17   Street, [place], PA-C  ondansetron (ZOFRAN ODT) 4 MG disintegrating tablet 4mg  ODT q4 hours prn nausea/vomit 05/29/19   Julianne Rice, MD  potassium chloride (KLOR-CON) 10 MEQ tablet Take 1 tablet (10 mEq total) by mouth daily. 05/30/19   Julianne Rice, MD  sulfamethoxazole-trimethoprim (BACTRIM DS,SEPTRA DS) 800-160 MG tablet Take 1 tablet by mouth 2 (two) times daily. Patient not taking: Reported on 04/29/2019 12/27/17   Luvenia Redden, PA-C    Family History Family History  Problem Relation Age of Onset  . Obesity Other   . Hypertension Other   . Glaucoma Other   . Cancer Other     Social History Social History   Tobacco Use  . Smoking status: Never Smoker  . Smokeless tobacco: Never  Used  Substance Use Topics  . Alcohol use: Yes    Comment: rare  . Drug use: No     Allergies   Patient has no known allergies.   Review of Systems Review of Systems  Constitutional: Positive for activity change, fatigue and fever.  HENT: Negative for sore throat and trouble swallowing.   Respiratory: Positive for cough and shortness of breath. Negative for wheezing.   Cardiovascular: Negative for chest pain, palpitations and leg swelling.  Gastrointestinal: Positive for diarrhea, nausea and vomiting. Negative for abdominal pain.  Musculoskeletal: Negative for back pain, neck pain and neck stiffness.  Skin: Negative for rash and wound.  Neurological: Positive for headaches. Negative for weakness, light-headedness and numbness.  All other systems reviewed and are negative.    Physical Exam Updated Vital  Signs BP 130/70 (BP Location: Left Arm)   Pulse 98   Temp 98.9 F (37.2 C) (Oral)   Resp 19   SpO2 96%   Physical Exam Vitals signs and nursing note reviewed.  Constitutional:      Appearance: Normal appearance. She is well-developed.  HENT:     Head: Normocephalic and atraumatic.  Eyes:     Pupils: Pupils are equal, round, and reactive to light.  Neck:     Musculoskeletal: Normal range of motion and neck supple.  Cardiovascular:     Rate and Rhythm: Regular rhythm. Tachycardia present.  Pulmonary:     Effort: Pulmonary effort is normal.     Breath sounds: Normal breath sounds.  Abdominal:     Palpations: Abdomen is soft.     Tenderness: There is no abdominal tenderness. There is no guarding or rebound.  Musculoskeletal: Normal range of motion.        General: No swelling, tenderness, deformity or signs of injury.     Right lower leg: No edema.     Left lower leg: No edema.  Skin:    General: Skin is warm and dry.     Capillary Refill: Capillary refill takes less than 2 seconds.     Findings: No erythema or rash.  Neurological:     General: No focal deficit present.     Mental Status: She is alert and oriented to person, place, and time.  Psychiatric:        Behavior: Behavior normal.      ED Treatments / Results  Labs (all labs ordered are listed, but only abnormal results are displayed) Labs Reviewed  COMPREHENSIVE METABOLIC PANEL - Abnormal; Notable for the following components:      Result Value   Potassium 2.9 (*)    CO2 18 (*)    Glucose, Bld 111 (*)    Calcium 8.6 (*)    AST 143 (*)    ALT 168 (*)    Alkaline Phosphatase 133 (*)    All other components within normal limits  CBC WITH DIFFERENTIAL/PLATELET    EKG None  Radiology Dg Chest Port 1 View  Result Date: 05/29/2019 CLINICAL DATA:  Shortness of breath EXAM: PORTABLE CHEST 1 VIEW COMPARISON:  08/16/2014 FINDINGS: Low lung volumes. Right lower lobe opacity, suspicious for pneumonia, less  likely atelectasis. Increased interstitial markings. No pleural effusion or pneumothorax. The heart is normal in size. IMPRESSION: Right lower lobe opacity, suspicious for pneumonia, less likely atelectasis. Increased interstitial markings may reflect vascular crowding in the setting of low lung volumes but are considered worrisome for mild multifocal infection. Electronically Signed   By: Henderson Newcomer.D.  On: 05/29/2019 13:49    Procedures Procedures (including critical care time)  Medications Ordered in ED Medications  ondansetron (ZOFRAN) injection 4 mg (4 mg Intravenous Given 05/29/19 1257)  acetaminophen (TYLENOL) tablet 1,000 mg (1,000 mg Oral Given 05/29/19 1257)  sodium chloride 0.9 % bolus 2,000 mL (0 mLs Intravenous Stopped 05/29/19 1519)  ketorolac (TORADOL) 30 MG/ML injection 30 mg (30 mg Intravenous Given 05/29/19 1314)  potassium chloride 10 mEq in 100 mL IVPB (10 mEq Intravenous New Bag/Given 05/29/19 1610)     Initial Impression / Assessment and Plan / ED Course  I have reviewed the triage vital signs and the nursing notes.  Pertinent labs & imaging results that were available during my care of the patient were reviewed by me and considered in my medical decision making (see chart for details).       Patient is feeling much better after IV fluids and medication.  Vital signs have normalized.  She is not requiring any oxygen and maintaining saturations in the mid 90s.  Patient does have multifocal pneumonia consistent with COVID-19.  Patient has been given IV potassium replacement for her hypokalemia.  Strict return precautions have been given.   Final Clinical Impressions(s) / ED Diagnoses   Final diagnoses:  Pneumonia due to COVID-19 virus  Vomiting and diarrhea  Hypokalemia    ED Discharge Orders         Ordered    potassium chloride (KLOR-CON) 10 MEQ tablet  Daily     05/29/19 1734    ondansetron (ZOFRAN ODT) 4 MG disintegrating tablet     05/29/19 1734     benzonatate (TESSALON) 100 MG capsule  3 times daily PRN     05/29/19 1734           Julianne Rice, MD 05/29/19 1735

## 2019-05-30 ENCOUNTER — Other Ambulatory Visit: Payer: Self-pay

## 2019-05-30 ENCOUNTER — Inpatient Hospital Stay (HOSPITAL_COMMUNITY)
Admission: EM | Admit: 2019-05-30 | Discharge: 2019-06-06 | DRG: 177 | Disposition: A | Discharge status: Home / Self Care | Payer: BLUE CROSS/BLUE SHIELD | Attending: Internal Medicine | Admitting: Internal Medicine

## 2019-05-30 ENCOUNTER — Encounter (HOSPITAL_COMMUNITY): Payer: Self-pay

## 2019-05-30 DIAGNOSIS — R300 Dysuria: Secondary | ICD-10-CM | POA: Diagnosis not present

## 2019-05-30 DIAGNOSIS — R079 Chest pain, unspecified: Secondary | ICD-10-CM | POA: Diagnosis not present

## 2019-05-30 DIAGNOSIS — N200 Calculus of kidney: Secondary | ICD-10-CM | POA: Diagnosis not present

## 2019-05-30 DIAGNOSIS — A419 Sepsis, unspecified organism: Secondary | ICD-10-CM | POA: Diagnosis not present

## 2019-05-30 DIAGNOSIS — Z8249 Family history of ischemic heart disease and other diseases of the circulatory system: Secondary | ICD-10-CM

## 2019-05-30 DIAGNOSIS — Z6834 Body mass index (BMI) 34.0-34.9, adult: Secondary | ICD-10-CM

## 2019-05-30 DIAGNOSIS — J1289 Other viral pneumonia: Secondary | ICD-10-CM | POA: Diagnosis present

## 2019-05-30 DIAGNOSIS — R519 Headache, unspecified: Secondary | ICD-10-CM | POA: Diagnosis not present

## 2019-05-30 DIAGNOSIS — R112 Nausea with vomiting, unspecified: Secondary | ICD-10-CM | POA: Diagnosis present

## 2019-05-30 DIAGNOSIS — A0839 Other viral enteritis: Secondary | ICD-10-CM | POA: Diagnosis present

## 2019-05-30 DIAGNOSIS — I10 Essential (primary) hypertension: Secondary | ICD-10-CM | POA: Diagnosis not present

## 2019-05-30 DIAGNOSIS — R197 Diarrhea, unspecified: Secondary | ICD-10-CM | POA: Diagnosis present

## 2019-05-30 DIAGNOSIS — J9601 Acute respiratory failure with hypoxia: Secondary | ICD-10-CM | POA: Diagnosis not present

## 2019-05-30 DIAGNOSIS — R7401 Elevation of levels of liver transaminase levels: Secondary | ICD-10-CM | POA: Diagnosis present

## 2019-05-30 DIAGNOSIS — U071 COVID-19: Principal | ICD-10-CM | POA: Diagnosis present

## 2019-05-30 DIAGNOSIS — J069 Acute upper respiratory infection, unspecified: Secondary | ICD-10-CM | POA: Diagnosis not present

## 2019-05-30 DIAGNOSIS — R350 Frequency of micturition: Secondary | ICD-10-CM | POA: Diagnosis present

## 2019-05-30 DIAGNOSIS — R109 Unspecified abdominal pain: Secondary | ICD-10-CM | POA: Diagnosis not present

## 2019-05-30 DIAGNOSIS — E876 Hypokalemia: Secondary | ICD-10-CM | POA: Diagnosis present

## 2019-05-30 DIAGNOSIS — R1084 Generalized abdominal pain: Secondary | ICD-10-CM | POA: Diagnosis not present

## 2019-05-30 LAB — URINALYSIS, ROUTINE W REFLEX MICROSCOPIC
Bacteria, UA: NONE SEEN
Bilirubin Urine: NEGATIVE
Glucose, UA: NEGATIVE mg/dL
Hgb urine dipstick: NEGATIVE
Ketones, ur: 80 mg/dL — AB
Leukocytes,Ua: NEGATIVE
Nitrite: NEGATIVE
Protein, ur: 100 mg/dL — AB
Specific Gravity, Urine: 1.023 (ref 1.005–1.030)
pH: 5 (ref 5.0–8.0)

## 2019-05-30 MED ORDER — SODIUM CHLORIDE 0.9 % IV BOLUS
1000.0000 mL | Freq: Once | INTRAVENOUS | Status: AC
  Administered 2019-05-31: 1000 mL via INTRAVENOUS

## 2019-05-30 NOTE — ED Triage Notes (Signed)
Pt was diagnosed with COVID on Sept 25th. Pt reports she was here yesterday for COVID symptoms and denies any changes with breathing. Pt reports new flank pain and burning upon urination and states she's "having problems with her kidneys".

## 2019-05-31 ENCOUNTER — Inpatient Hospital Stay (HOSPITAL_COMMUNITY): Payer: BLUE CROSS/BLUE SHIELD

## 2019-05-31 ENCOUNTER — Encounter (HOSPITAL_COMMUNITY): Payer: Self-pay

## 2019-05-31 DIAGNOSIS — R300 Dysuria: Secondary | ICD-10-CM | POA: Diagnosis present

## 2019-05-31 DIAGNOSIS — R109 Unspecified abdominal pain: Secondary | ICD-10-CM | POA: Diagnosis present

## 2019-05-31 DIAGNOSIS — R197 Diarrhea, unspecified: Secondary | ICD-10-CM | POA: Diagnosis present

## 2019-05-31 DIAGNOSIS — R1084 Generalized abdominal pain: Secondary | ICD-10-CM

## 2019-05-31 DIAGNOSIS — E876 Hypokalemia: Secondary | ICD-10-CM | POA: Diagnosis present

## 2019-05-31 DIAGNOSIS — J9601 Acute respiratory failure with hypoxia: Secondary | ICD-10-CM | POA: Diagnosis present

## 2019-05-31 DIAGNOSIS — Z6834 Body mass index (BMI) 34.0-34.9, adult: Secondary | ICD-10-CM | POA: Diagnosis not present

## 2019-05-31 DIAGNOSIS — R112 Nausea with vomiting, unspecified: Secondary | ICD-10-CM

## 2019-05-31 DIAGNOSIS — J069 Acute upper respiratory infection, unspecified: Secondary | ICD-10-CM | POA: Diagnosis not present

## 2019-05-31 DIAGNOSIS — U071 COVID-19: Secondary | ICD-10-CM | POA: Diagnosis present

## 2019-05-31 DIAGNOSIS — R079 Chest pain, unspecified: Secondary | ICD-10-CM | POA: Diagnosis not present

## 2019-05-31 DIAGNOSIS — I10 Essential (primary) hypertension: Secondary | ICD-10-CM | POA: Diagnosis present

## 2019-05-31 DIAGNOSIS — J1289 Other viral pneumonia: Secondary | ICD-10-CM | POA: Diagnosis present

## 2019-05-31 DIAGNOSIS — R519 Headache, unspecified: Secondary | ICD-10-CM | POA: Diagnosis present

## 2019-05-31 DIAGNOSIS — R350 Frequency of micturition: Secondary | ICD-10-CM | POA: Diagnosis present

## 2019-05-31 DIAGNOSIS — R7401 Elevation of levels of liver transaminase levels: Secondary | ICD-10-CM | POA: Diagnosis present

## 2019-05-31 DIAGNOSIS — A0839 Other viral enteritis: Secondary | ICD-10-CM | POA: Diagnosis present

## 2019-05-31 DIAGNOSIS — A419 Sepsis, unspecified organism: Secondary | ICD-10-CM

## 2019-05-31 DIAGNOSIS — Z8249 Family history of ischemic heart disease and other diseases of the circulatory system: Secondary | ICD-10-CM | POA: Diagnosis not present

## 2019-05-31 LAB — TYPE AND SCREEN
ABO/RH(D): A POS
Antibody Screen: NEGATIVE

## 2019-05-31 LAB — CBC
HCT: 37.2 % (ref 36.0–46.0)
Hemoglobin: 12.3 g/dL (ref 12.0–15.0)
MCH: 30.8 pg (ref 26.0–34.0)
MCHC: 33.1 g/dL (ref 30.0–36.0)
MCV: 93.2 fL (ref 80.0–100.0)
Platelets: 229 10*3/uL (ref 150–400)
RBC: 3.99 MIL/uL (ref 3.87–5.11)
RDW: 13.5 % (ref 11.5–15.5)
WBC: 7.7 10*3/uL (ref 4.0–10.5)
nRBC: 0 % (ref 0.0–0.2)

## 2019-05-31 LAB — COMPREHENSIVE METABOLIC PANEL
ALT: 161 U/L — ABNORMAL HIGH (ref 0–44)
AST: 135 U/L — ABNORMAL HIGH (ref 15–41)
Albumin: 3.1 g/dL — ABNORMAL LOW (ref 3.5–5.0)
Alkaline Phosphatase: 142 U/L — ABNORMAL HIGH (ref 38–126)
Anion gap: 12 (ref 5–15)
BUN: <5 mg/dL — ABNORMAL LOW (ref 6–20)
CO2: 19 mmol/L — ABNORMAL LOW (ref 22–32)
Calcium: 8.1 mg/dL — ABNORMAL LOW (ref 8.9–10.3)
Chloride: 108 mmol/L (ref 98–111)
Creatinine, Ser: 0.6 mg/dL (ref 0.44–1.00)
GFR calc Af Amer: >60 mL/min (ref >60)
GFR calc non Af Amer: >60 mL/min (ref >60)
Glucose, Bld: 110 mg/dL — ABNORMAL HIGH (ref 70–99)
Potassium: 3.6 mmol/L (ref 3.5–5.1)
Sodium: 139 mmol/L (ref 135–145)
Total Bilirubin: 0.8 mg/dL (ref 0.3–1.2)
Total Protein: 7.5 g/dL (ref 6.5–8.1)

## 2019-05-31 LAB — LIPID PANEL
Cholesterol: 174 mg/dL (ref 0–200)
HDL: 42 mg/dL (ref >40)
LDL Cholesterol: 107 mg/dL — ABNORMAL HIGH (ref 0–99)
Total CHOL/HDL Ratio: 4.1 RATIO
Triglycerides: 123 mg/dL (ref <150)
VLDL: 25 mg/dL (ref 0–40)

## 2019-05-31 LAB — LACTATE DEHYDROGENASE: LDH: 474 U/L — ABNORMAL HIGH (ref 98–192)

## 2019-05-31 LAB — I-STAT BETA HCG BLOOD, ED (MC, WL, AP ONLY): I-stat hCG, quantitative: <5 m[IU]/mL (ref <5)

## 2019-05-31 LAB — RAPID URINE DRUG SCREEN, HOSP PERFORMED
Amphetamines: NOT DETECTED
Barbiturates: NOT DETECTED
Benzodiazepines: NOT DETECTED
Cocaine: NOT DETECTED
Opiates: NOT DETECTED
Tetrahydrocannabinol: NOT DETECTED

## 2019-05-31 LAB — BASIC METABOLIC PANEL
Anion gap: 12 (ref 5–15)
BUN: 5 mg/dL — ABNORMAL LOW (ref 6–20)
CO2: 20 mmol/L — ABNORMAL LOW (ref 22–32)
Calcium: 8.4 mg/dL — ABNORMAL LOW (ref 8.9–10.3)
Chloride: 109 mmol/L (ref 98–111)
Creatinine, Ser: 0.59 mg/dL (ref 0.44–1.00)
GFR calc Af Amer: >60 mL/min (ref >60)
GFR calc non Af Amer: >60 mL/min (ref >60)
Glucose, Bld: 94 mg/dL (ref 70–99)
Potassium: 3 mmol/L — ABNORMAL LOW (ref 3.5–5.1)
Sodium: 141 mmol/L (ref 135–145)

## 2019-05-31 LAB — HEPATITIS B SURFACE ANTIGEN: Hepatitis B Surface Ag: NONREACTIVE

## 2019-05-31 LAB — SARS CORONAVIRUS 2 BY RT PCR (HOSPITAL ORDER, PERFORMED IN ~~LOC~~ HOSPITAL LAB): SARS Coronavirus 2: POSITIVE — AB

## 2019-05-31 LAB — HEMOGLOBIN A1C
Hgb A1c MFr Bld: 5.7 % — ABNORMAL HIGH (ref 4.8–5.6)
Mean Plasma Glucose: 116.89 mg/dL

## 2019-05-31 LAB — D-DIMER, QUANTITATIVE: D-Dimer, Quant: 2.14 ug/mL-FEU — ABNORMAL HIGH (ref 0.00–0.50)

## 2019-05-31 LAB — STREP PNEUMONIAE URINARY ANTIGEN: Strep Pneumo Urinary Antigen: NEGATIVE

## 2019-05-31 LAB — LIPASE, BLOOD: Lipase: 24 U/L (ref 11–51)

## 2019-05-31 LAB — TROPONIN I (HIGH SENSITIVITY)
Troponin I (High Sensitivity): 17 ng/L (ref <18)
Troponin I (High Sensitivity): 18 ng/L — ABNORMAL HIGH (ref <18)

## 2019-05-31 LAB — BRAIN NATRIURETIC PEPTIDE: B Natriuretic Peptide: 39.5 pg/mL (ref 0.0–100.0)

## 2019-05-31 LAB — PROCALCITONIN: Procalcitonin: <0.1 ng/mL

## 2019-05-31 LAB — LACTIC ACID, PLASMA
Lactic Acid, Venous: 0.6 mmol/L (ref 0.5–1.9)
Lactic Acid, Venous: 0.7 mmol/L (ref 0.5–1.9)

## 2019-05-31 LAB — FERRITIN: Ferritin: 429 ng/mL — ABNORMAL HIGH (ref 11–307)

## 2019-05-31 LAB — ABO/RH: ABO/RH(D): A POS

## 2019-05-31 LAB — C-REACTIVE PROTEIN: CRP: 27.3 mg/dL — ABNORMAL HIGH (ref <1.0)

## 2019-05-31 LAB — FIBRINOGEN: Fibrinogen: 738 mg/dL — ABNORMAL HIGH (ref 210–475)

## 2019-05-31 LAB — HIV ANTIBODY (ROUTINE TESTING W REFLEX): HIV Screen 4th Generation wRfx: NONREACTIVE

## 2019-05-31 MED ORDER — ASPIRIN-ACETAMINOPHEN-CAFFEINE 250-250-65 MG PO TABS
1.0000 | ORAL_TABLET | Freq: Every day | ORAL | Status: DC | PRN
  Administered 2019-05-31: 11:00:00 1 via ORAL
  Filled 2019-05-31 (×2): qty 1

## 2019-05-31 MED ORDER — IPRATROPIUM BROMIDE HFA 17 MCG/ACT IN AERS
2.0000 | INHALATION_SPRAY | RESPIRATORY_TRACT | Status: DC
  Administered 2019-05-31 – 2019-06-02 (×13): 2 via RESPIRATORY_TRACT
  Filled 2019-05-31: qty 12.9

## 2019-05-31 MED ORDER — SODIUM CHLORIDE 0.9 % IV SOLN
1.0000 g | INTRAVENOUS | Status: DC
  Administered 2019-05-31: 06:00:00 1 g via INTRAVENOUS
  Filled 2019-05-31: qty 10

## 2019-05-31 MED ORDER — ONDANSETRON HCL 4 MG PO TABS
4.0000 mg | ORAL_TABLET | Freq: Four times a day (QID) | ORAL | Status: DC | PRN
  Administered 2019-06-01 – 2019-06-03 (×2): 4 mg via ORAL
  Filled 2019-05-31 (×2): qty 1

## 2019-05-31 MED ORDER — LEVALBUTEROL TARTRATE 45 MCG/ACT IN AERO
2.0000 | INHALATION_SPRAY | Freq: Four times a day (QID) | RESPIRATORY_TRACT | Status: DC | PRN
  Administered 2019-05-31 – 2019-06-06 (×6): 2 via RESPIRATORY_TRACT
  Filled 2019-05-31: qty 15

## 2019-05-31 MED ORDER — POTASSIUM CHLORIDE CRYS ER 20 MEQ PO TBCR
60.0000 meq | EXTENDED_RELEASE_TABLET | Freq: Once | ORAL | Status: AC
  Administered 2019-05-31: 60 meq via ORAL
  Filled 2019-05-31: qty 3

## 2019-05-31 MED ORDER — MORPHINE SULFATE (PF) 2 MG/ML IV SOLN
2.0000 mg | INTRAVENOUS | Status: DC | PRN
  Administered 2019-05-31 – 2019-06-01 (×2): 2 mg via INTRAVENOUS
  Filled 2019-05-31 (×2): qty 1

## 2019-05-31 MED ORDER — DM-GUAIFENESIN ER 30-600 MG PO TB12
1.0000 | ORAL_TABLET | Freq: Two times a day (BID) | ORAL | Status: DC | PRN
  Administered 2019-05-31 – 2019-06-05 (×5): 1 via ORAL
  Filled 2019-05-31 (×5): qty 1

## 2019-05-31 MED ORDER — HYDRALAZINE HCL 25 MG PO TABS
25.0000 mg | ORAL_TABLET | Freq: Three times a day (TID) | ORAL | Status: DC | PRN
  Administered 2019-06-04: 25 mg via ORAL
  Filled 2019-05-31 (×2): qty 1

## 2019-05-31 MED ORDER — ZINC SULFATE 220 (50 ZN) MG PO CAPS
220.0000 mg | ORAL_CAPSULE | Freq: Every day | ORAL | Status: DC
  Administered 2019-05-31 – 2019-06-04 (×5): 220 mg via ORAL
  Filled 2019-05-31 (×5): qty 1

## 2019-05-31 MED ORDER — ENOXAPARIN SODIUM 40 MG/0.4ML ~~LOC~~ SOLN
40.0000 mg | Freq: Every day | SUBCUTANEOUS | Status: DC
  Administered 2019-05-31 – 2019-06-06 (×7): 40 mg via SUBCUTANEOUS
  Filled 2019-05-31 (×8): qty 0.4

## 2019-05-31 MED ORDER — METHYLPREDNISOLONE SODIUM SUCC 40 MG IJ SOLR
40.0000 mg | Freq: Two times a day (BID) | INTRAMUSCULAR | Status: AC
  Administered 2019-05-31 – 2019-06-03 (×8): 40 mg via INTRAVENOUS
  Filled 2019-05-31 (×8): qty 1

## 2019-05-31 MED ORDER — AZITHROMYCIN 250 MG PO TABS: 500.0000 mg | ORAL_TABLET | Freq: Every day | ORAL | Status: DC

## 2019-05-31 MED ORDER — AZITHROMYCIN 250 MG PO TABS: 250.0000 mg | ORAL_TABLET | Freq: Every day | ORAL | Status: DC

## 2019-05-31 MED ORDER — ONDANSETRON HCL 4 MG/2ML IJ SOLN
4.0000 mg | Freq: Four times a day (QID) | INTRAMUSCULAR | Status: DC | PRN
  Administered 2019-05-31 (×2): 4 mg via INTRAVENOUS
  Filled 2019-05-31 (×2): qty 2

## 2019-05-31 MED ORDER — FAMOTIDINE IN NACL 20-0.9 MG/50ML-% IV SOLN
20.0000 mg | Freq: Two times a day (BID) | INTRAVENOUS | Status: DC
  Administered 2019-05-31 – 2019-06-02 (×5): 20 mg via INTRAVENOUS
  Filled 2019-05-31 (×5): qty 50

## 2019-05-31 MED ORDER — IOHEXOL 350 MG/ML SOLN
100.0000 mL | Freq: Once | INTRAVENOUS | Status: AC | PRN
  Administered 2019-05-31: 04:00:00 100 mL via INTRAVENOUS

## 2019-05-31 MED ORDER — SODIUM CHLORIDE 0.9 % IV SOLN
100.0000 mg | INTRAVENOUS | Status: AC
  Administered 2019-06-01 – 2019-06-04 (×4): 100 mg via INTRAVENOUS
  Filled 2019-05-31 (×4): qty 20

## 2019-05-31 MED ORDER — POTASSIUM CHLORIDE 10 MEQ/100ML IV SOLN: 10.0000 meq | INTRAVENOUS | Status: DC

## 2019-05-31 MED ORDER — HEPARIN SODIUM (PORCINE) 5000 UNIT/ML IJ SOLN: 5000.0000 [IU] | Freq: Three times a day (TID) | INTRAMUSCULAR | Status: DC

## 2019-05-31 MED ORDER — TOCILIZUMAB 400 MG/20ML IV SOLN
600.0000 mg | Freq: Once | INTRAVENOUS | Status: AC
  Administered 2019-05-31: 600 mg via INTRAVENOUS
  Filled 2019-05-31: qty 10

## 2019-05-31 MED ORDER — VITAMIN C 500 MG PO TABS
500.0000 mg | ORAL_TABLET | Freq: Every day | ORAL | Status: DC
  Administered 2019-05-31 – 2019-06-04 (×5): 500 mg via ORAL
  Filled 2019-05-31 (×5): qty 1

## 2019-05-31 MED ORDER — SODIUM CHLORIDE 0.9 % IV SOLN
200.0000 mg | Freq: Once | INTRAVENOUS | Status: AC
  Administered 2019-05-31: 13:00:00 200 mg via INTRAVENOUS
  Filled 2019-05-31: qty 40

## 2019-05-31 MED ORDER — POTASSIUM CHLORIDE CRYS ER 20 MEQ PO TBCR: 40.0000 meq | EXTENDED_RELEASE_TABLET | Freq: Once | ORAL | Status: DC

## 2019-05-31 MED ORDER — ACETAMINOPHEN 325 MG PO TABS
650.0000 mg | ORAL_TABLET | Freq: Four times a day (QID) | ORAL | Status: DC | PRN
  Administered 2019-05-31 – 2019-06-06 (×5): 650 mg via ORAL
  Filled 2019-05-31 (×5): qty 2

## 2019-05-31 NOTE — ED Notes (Signed)
Called report to Butch Penny at [facility], 1E room 109. Let her know that [facility] will not be here to get pt until after 7am.

## 2019-05-31 NOTE — Progress Notes (Signed)
Pt arrived and admitted . On 3L [place]. Sats 97%. Alert and oriented. Lethargic. 20 G LFA C/D/I. Denies pain, dizziness,. C/o shortness of breath w/ movement and talking. MD notified of pt arrival. Daughter Taisha Knuckles [phone] notified of pt arrival and updated.

## 2019-05-31 NOTE — TOC Initial Note (Signed)
Transition of Care [facility]) - Initial/Assessment Note    Patient Details  Name: Kari Blake MRN: [id number] Date of Birth: 02/24/1966  [facility] CM/SW Contact:    Ninfa Meeker, RN Phone Number: [phone] (working remotely) 05/31/2019, 3:12 PM  Clinical Narrative:   53 yr old female admitted with COVID 19.  Patient is from Home. Case manager will continue to follow for needs as she medically improves. Currently on oxygen 1-2 L  .                  Patient Goals and CMS Choice        Expected Discharge Plan and Services           Expected Discharge Date: (unknown)                                    Prior Living Arrangements/Services                       Activities of Daily Living Home Assistive Devices/Equipment: None ADL Screening (condition at time of admission) Patient's cognitive ability adequate to safely complete daily activities?: Yes Is the patient deaf or have difficulty hearing?: No Does the patient have difficulty seeing, even when wearing glasses/contacts?: No Does the patient have difficulty concentrating, remembering, or making decisions?: No Patient able to express need for assistance with ADLs?: Yes Does the patient have difficulty dressing or bathing?: No Independently performs ADLs?: Yes (appropriate for developmental age) Does the patient have difficulty walking or climbing stairs?: No Weakness of Legs: Both Weakness of Arms/Hands: None  Permission Sought/Granted                  Emotional Assessment              Admission diagnosis:  COVID-19 [U07.1] Patient Active Problem List   Diagnosis Date Noted  . Abdominal pain 05/31/2019  . Chest pain 05/31/2019  . COVID-19 virus infection 05/31/2019  . Headache 05/31/2019  . Hypertension   . Acute respiratory disease due to COVID-19 virus   . Sepsis ([place])   . Hypokalemia   . Nausea vomiting and diarrhea   . Acute respiratory failure  with hypoxia ([place])   . Pes planus 04/29/2019   PCP:  Mayra Neer, MD Pharmacy:   [facility] [place], [address] [place] [address] Phone: [phone] Fax: [phone]     Social Determinants of Health (SDOH) Interventions    Readmission Risk Interventions No flowsheet data found.

## 2019-05-31 NOTE — Plan of Care (Signed)
Personal belongings brought to husband via [facility] courier:)

## 2019-05-31 NOTE — ED Notes (Signed)
ED TO INPATIENT HANDOFF REPORT  ED Nurse Name and Phone #: Leafy Kindle [phone]  S Name/Age/Gender Kari Blake 53 y.o. female Room/Bed: WA12/WA12  Code Status   Code Status: Full Code  Home/SNF/Other Home Patient oriented to: self, place, time and situation Is this baseline? Yes   Triage Complete: Triage complete  Chief Complaint Flu Like Symptoms   Triage Note Pt was diagnosed with COVID on Sept 25th. Pt reports she was here yesterday for COVID symptoms and denies any changes with breathing. Pt reports new flank pain and burning upon urination and states she's "having problems with her kidneys".    Allergies No Known Allergies  Level of Care/Admitting Diagnosis ED Disposition    ED Disposition Condition [facility]: [place] [[id number]]  Level of Care: Telemetry [5]  Covid Evaluation: Confirmed COVID Positive  Diagnosis: COVID-19 virus infection [id number]  Admitting Physician: Ivor Costa [[id number]]  Attending Physician: Ivor Costa [phone]  Estimated length of stay: past midnight tomorrow  Certification:: I certify this patient will need inpatient services for at least 2 midnights  PT Class (Do Not Modify): Inpatient [101]  PT Acc Code (Do Not Modify): Private [1]       B Medical/Surgery History Past Medical History:  Diagnosis Date  . Fibroid   . Headache   . Hypertension   . Trichomonas infection    Past Surgical History:  Procedure Laterality Date  . CESAREAN SECTION    . EYE SURGERY    . TUBAL LIGATION       A IV Location/Drains/Wounds Patient Lines/Drains/Airways Status   Active Line/Drains/Airways    Name:   Placement date:   Placement time:   Site:   Days:   Peripheral IV 05/31/19 Left Forearm   05/31/19    0023    Forearm   less than 1          Intake/Output Last 24 hours  Intake/Output Summary (Last 24 hours) at 05/31/2019 [id number] Last data filed at 05/31/2019 0301 Gross per 24 hour   Intake 1000 ml  Output -  Net 1000 ml    Labs/Imaging Results for orders placed or performed during the hospital encounter of 05/30/19 (from the past 48 hour(s))  Urinalysis, Routine w reflex microscopic- may I&O cath if menses     Status: Abnormal   Collection Time: 05/30/19 10:20 PM  Result Value Ref Range   Color, Urine YELLOW YELLOW   APPearance HAZY (A) CLEAR   Specific Gravity, Urine 1.023 1.005 - 1.030   pH 5.0 5.0 - 8.0   Glucose, UA NEGATIVE NEGATIVE mg/dL   Hgb urine dipstick NEGATIVE NEGATIVE   Bilirubin Urine NEGATIVE NEGATIVE   Ketones, ur 80 (A) NEGATIVE mg/dL   Protein, ur 100 (A) NEGATIVE mg/dL   Nitrite NEGATIVE NEGATIVE   Leukocytes,Ua NEGATIVE NEGATIVE   RBC / HPF 0-5 0 - 5 RBC/hpf   WBC, UA 0-5 0 - 5 WBC/hpf   Bacteria, UA NONE SEEN NONE SEEN   Squamous Epithelial / LPF 6-10 0 - 5   Mucus PRESENT     Comment: Performed at [facility], [place] [address], [place], [place] [address]  CBC     Status: None   Collection Time: 05/31/19 12:11 AM  Result Value Ref Range   WBC 7.7 4.0 - 10.5 K/uL   RBC 3.99 3.87 - 5.11 MIL/uL   Hemoglobin 12.3 12.0 - 15.0 g/dL   HCT 37.2 36.0 -  46.0 %   MCV 93.2 80.0 - 100.0 fL   MCH 30.8 26.0 - 34.0 pg   MCHC 33.1 30.0 - 36.0 g/dL   RDW 13.5 11.5 - 15.5 %   Platelets 229 150 - 400 K/uL   nRBC 0.0 0.0 - 0.2 %    Comment: Performed at [facility], [place] [address] [place], [place] [id number]  Basic metabolic panel     Status: Abnormal   Collection Time: 05/31/19 12:11 AM  Result Value Ref Range   Sodium 141 135 - 145 mmol/L   Potassium 3.0 (L) 3.5 - 5.1 mmol/L   Chloride 109 98 - 111 mmol/L   CO2 20 (L) 22 - 32 mmol/L   Glucose, Bld 94 70 - 99 mg/dL   BUN 5 (L) 6 - 20 mg/dL   Creatinine, Ser 0.59 0.44 - 1.00 mg/dL   Calcium 8.4 (L) 8.9 - 10.3 mg/dL   GFR calc non Af Amer >60 >60 mL/min   GFR calc Af Amer >60 >60 mL/min   Anion gap 12 5 - 15    Comment: Performed at [facility], [place] [address], [place], [place] [address]  SARS Coronavirus 2 [facility] order, Performed in [facility] hospital lab) Nasopharyngeal Nasopharyngeal Swab     Status: Abnormal   Collection Time: 05/31/19 12:11 AM   Specimen: Nasopharyngeal Swab  Result Value Ref Range   SARS Coronavirus 2 POSITIVE (A) NEGATIVE    Comment: RESULT CALLED TO, READ BACK BY AND VERIFIED WITH: RHONEY,A AT 0155 ON 05/31/2019 BY JPM (NOTE) If result is NEGATIVE SARS-CoV-2 target nucleic acids are NOT DETECTED. The SARS-CoV-2 RNA is generally detectable in upper and lower  respiratory specimens during the acute phase of infection. The lowest  concentration of SARS-CoV-2 viral copies this assay can detect is 250  copies / mL. A negative result does not preclude SARS-CoV-2 infection  and should not be used as the sole basis for treatment or other  patient management decisions.  A negative result may occur with  improper specimen collection / handling, submission of specimen other  than nasopharyngeal swab, presence of viral mutation(s) within the  areas targeted by this assay, and inadequate number of viral copies  (<250 copies / mL). A negative result must be combined with clinical  observations, patient history, and epidemiological information. If result is POSITIVE SARS-CoV-2 target nucleic acids are DETECTED.  The SARS-CoV-2 RNA is generally detectable in upper and lower  respiratory specimens during the acute phase of infection.  Positive  results are indicative of active infection with SARS-CoV-2.  Clinical  correlation with patient history and other diagnostic information is  necessary to determine patient infection status.  Positive results do  not rule out bacterial infection or co-infection with other viruses. If result is PRESUMPTIVE POSTIVE SARS-CoV-2 nucleic acids MAY BE PRESENT.   A presumptive positive result was obtained on the submitted specimen  and confirmed on repeat  testing.  While 2019 novel coronavirus  (SARS-CoV-2) nucleic acids may be present in the submitted sample  additional confirmatory testing may be necessary for epidemiological  and / or clinical management purposes  to differentiate between  SARS-CoV-2 and other Sarbecovirus currently known to infect humans.  If clinically indicated additional testing with an alternate test  methodology [phone]) i s advised. The SARS-CoV-2 RNA is generally  detectable in upper and lower respiratory specimens during the acute  phase of infection. The expected result is Negative. Fact Sheet for Patients:  StrictlyIdeas.no Fact Sheet for Healthcare Providers: BankingDealers.co.za This test is not yet approved or cleared by the [place] FDA and has been authorized for detection and/or diagnosis of SARS-CoV-2 by FDA under an Emergency Use Authorization (EUA).  This EUA will remain in effect (meaning this test can be used) for the duration of the COVID-19 declaration under Section 564(b)(1) of the Act, 21 U.S.C. section 360bbb-3(b)(1), unless the authorization is terminated or revoked sooner. Performed at [facility], [place] [address] [place], [place] [address]   I-Stat Beta hCG blood, ED ([facility], [facility], [facility] only)     Status: None   Collection Time: 05/31/19 12:39 AM  Result Value Ref Range   I-stat hCG, quantitative <5.0 <5 mIU/mL   Comment 3            Comment:   GEST. AGE      CONC.  (mIU/mL)   <=1 WEEK        5 - 50     2 WEEKS       50 - 500     3 WEEKS       100 - 10,000     4 WEEKS     1,000 - 30,000        FEMALE AND NON-PREGNANT FEMALE:     LESS THAN 5 mIU/mL    Ct Angio Chest Pe W Or Wo Contrast  Result Date: 05/31/2019 CLINICAL DATA:  COVID-19.  Flank pain and burning with urination. EXAM: CT ANGIOGRAPHY CHEST CT ABDOMEN AND PELVIS WITH CONTRAST TECHNIQUE: Multidetector CT imaging of the chest was performed using the standard  protocol during bolus administration of intravenous contrast. Multiplanar CT image reconstructions and MIPs were obtained to evaluate the vascular anatomy. Multidetector CT imaging of the abdomen and pelvis was performed using the standard protocol during bolus administration of intravenous contrast. CONTRAST:  167mL OMNIPAQUE IOHEXOL 350 MG/ML SOLN COMPARISON:  Renal stone study 09/25/2016. Chest x-ray from 2 days ago FINDINGS: CTA CHEST FINDINGS Cardiovascular: Normal heart size. No pericardial effusion. Significant motion artifact with no evidence of pulmonary embolism. Negative aorta. Mediastinum/Nodes: Partially covered goiter.  No adenopathy Lungs/Pleura: Patchy ground-glass opacity in the bilateral lungs correlating with history of COVID-19 positivity. The opacity is extensive. No superimposed edema or pleural fluid Musculoskeletal: Generalized spondylosis. Review of the MIP images confirms the above findings. CT ABDOMEN and PELVIS FINDINGS Hepatobiliary: No focal liver abnormality.No evidence of biliary obstruction or stone. Pancreas: Unremarkable. Spleen: Unremarkable. Adrenals/Urinary Tract: Negative adrenals. No hydronephrosis or ureteral stone. 5 mm left lower pole calculus. A small right renal calculus is vascular. Unremarkable bladder. Stomach/Bowel:  No obstruction. No evidence of bowel inflammation. Vascular/Lymphatic: No acute vascular abnormality. No mass or adenopathy. Reproductive:Enlarged uterus from fundic mass which is heterogeneously enhancing and measures up to nearly 10 cm in diameter, appearance similar to prior. The uterus reaches the umbilicus. Other: No ascites or pneumoperitoneum. Small fatty umbilical hernia. Musculoskeletal: No acute abnormalities. Generalized moderate lumbar spine degeneration Review of the MIP images confirms the above findings. IMPRESSION: Chest CTA: 1. Extensive ground-glass pulmonary opacity that correlates well with COVID-19 positivity. 2. Significant motion  degradation with no evidence of pulmonary embolism. Abdominal CT: 1. No acute finding. 2. Chronic uterine mass attributed to a 10 cm fibroid. 3. Nonobstructive left renal calculus. Electronically Signed   By: Monte Fantasia M.D.   On: 05/31/2019 04:50   Ct Abdomen Pelvis W Contrast  Result Date: 05/31/2019 CLINICAL DATA:  COVID-19.  Flank pain and burning  with urination. EXAM: CT ANGIOGRAPHY CHEST CT ABDOMEN AND PELVIS WITH CONTRAST TECHNIQUE: Multidetector CT imaging of the chest was performed using the standard protocol during bolus administration of intravenous contrast. Multiplanar CT image reconstructions and MIPs were obtained to evaluate the vascular anatomy. Multidetector CT imaging of the abdomen and pelvis was performed using the standard protocol during bolus administration of intravenous contrast. CONTRAST:  160mL OMNIPAQUE IOHEXOL 350 MG/ML SOLN COMPARISON:  Renal stone study 09/25/2016. Chest x-ray from 2 days ago FINDINGS: CTA CHEST FINDINGS Cardiovascular: Normal heart size. No pericardial effusion. Significant motion artifact with no evidence of pulmonary embolism. Negative aorta. Mediastinum/Nodes: Partially covered goiter.  No adenopathy Lungs/Pleura: Patchy ground-glass opacity in the bilateral lungs correlating with history of COVID-19 positivity. The opacity is extensive. No superimposed edema or pleural fluid Musculoskeletal: Generalized spondylosis. Review of the MIP images confirms the above findings. CT ABDOMEN and PELVIS FINDINGS Hepatobiliary: No focal liver abnormality.No evidence of biliary obstruction or stone. Pancreas: Unremarkable. Spleen: Unremarkable. Adrenals/Urinary Tract: Negative adrenals. No hydronephrosis or ureteral stone. 5 mm left lower pole calculus. A small right renal calculus is vascular. Unremarkable bladder. Stomach/Bowel:  No obstruction. No evidence of bowel inflammation. Vascular/Lymphatic: No acute vascular abnormality. No mass or adenopathy.  Reproductive:Enlarged uterus from fundic mass which is heterogeneously enhancing and measures up to nearly 10 cm in diameter, appearance similar to prior. The uterus reaches the umbilicus. Other: No ascites or pneumoperitoneum. Small fatty umbilical hernia. Musculoskeletal: No acute abnormalities. Generalized moderate lumbar spine degeneration Review of the MIP images confirms the above findings. IMPRESSION: Chest CTA: 1. Extensive ground-glass pulmonary opacity that correlates well with COVID-19 positivity. 2. Significant motion degradation with no evidence of pulmonary embolism. Abdominal CT: 1. No acute finding. 2. Chronic uterine mass attributed to a 10 cm fibroid. 3. Nonobstructive left renal calculus. Electronically Signed   By: Monte Fantasia M.D.   On: 05/31/2019 04:50   Dg Chest Port 1 View  Result Date: 05/29/2019 CLINICAL DATA:  Shortness of breath EXAM: PORTABLE CHEST 1 VIEW COMPARISON:  08/16/2014 FINDINGS: Low lung volumes. Right lower lobe opacity, suspicious for pneumonia, less likely atelectasis. Increased interstitial markings. No pleural effusion or pneumothorax. The heart is normal in size. IMPRESSION: Right lower lobe opacity, suspicious for pneumonia, less likely atelectasis. Increased interstitial markings may reflect vascular crowding in the setting of low lung volumes but are considered worrisome for mild multifocal infection. Electronically Signed   By: Julian Hy M.D.   On: 05/29/2019 13:49    Pending Labs Unresulted Labs (From admission, onward)    Start     Ordered   06/01/19 0500  CBC with Differential/Platelet  Daily,   R     05/31/19 0527   06/01/19 0500  Comprehensive metabolic panel  Daily,   R     05/31/19 0527   06/01/19 0500  C-reactive protein  Daily,   R     05/31/19 0527   06/01/19 0500  D-dimer, quantitative (not at [facility])  Daily,   R     05/31/19 0527   06/01/19 0500  Ferritin  Daily,   R     05/31/19 0527   06/01/19 0500  Magnesium  Daily,   R      05/31/19 0527   05/31/19 0528  Hemoglobin A1c  Tomorrow morning,   R     05/31/19 0527   05/31/19 0528  Lipid panel  Tomorrow morning,   R    Comments: Please obtain as a fasting lipid panel -  should not have eaten/ drank food for 8 hours prior to labs.    05/31/19 0527   05/31/19 0528  Urine rapid drug screen (hosp performed)  ONCE - STAT,   STAT     05/31/19 0527   05/31/19 0528  Influenza panel by PCR (type A & B)  Add-on,   AD     05/31/19 0527   05/31/19 0528  Respiratory Panel by PCR  (Respiratory virus panel with precautions)  Add-on,   AD     05/31/19 0527   05/31/19 0528  HIV Antibody (routine testing w rflx)  (HIV Antibody (Routine testing w reflex) panel)  Once,   STAT     05/31/19 0527   05/31/19 0528  HIV4GL Save Tube  (HIV Antibody (Routine testing w reflex) panel)  Once,   STAT     05/31/19 0527   05/31/19 0528  ABO/Rh  Once,   STAT     05/31/19 0527   05/31/19 0528  Type and screen [place]  Once,   STAT    Comments: [place]    05/31/19 0527   05/31/19 0528  Brain natriuretic peptide  Once,   STAT     05/31/19 0527   05/31/19 0528  C-reactive protein  Once,   STAT     05/31/19 0527   05/31/19 0528  D-dimer, quantitative (not at [facility])  Once,   STAT     05/31/19 0527   05/31/19 0528  Ferritin  Once,   STAT     05/31/19 0527   05/31/19 0528  Fibrinogen  Once,   STAT     05/31/19 0527   05/31/19 0528  Hepatitis B surface antigen  Once,   STAT     05/31/19 0527   05/31/19 0528  Lactate dehydrogenase  Once,   STAT     05/31/19 0527   05/31/19 0528  Culture, blood (Routine X 2) w Reflex to ID Panel  BLOOD CULTURE X 2,   R (with STAT occurrences)     05/31/19 0527   05/31/19 0528  Lactic acid, plasma  STAT Now then every 2 hours,   STAT     05/31/19 0527   05/31/19 0528  Procalcitonin  ONCE - STAT,   STAT     05/31/19 0527   05/31/19 0528  Lipase, blood  Once,   STAT     05/31/19 0527   05/31/19 0528  Legionella  Pneumophila Serogp 1 Ur Ag  Once,   STAT     05/31/19 0527   05/31/19 0528  Strep pneumoniae urinary antigen  Once,   STAT     05/31/19 0527   05/31/19 0528  Culture, sputum-assessment  Once,   R     05/31/19 0527          Vitals/Pain Today's Vitals   05/31/19 0500 05/31/19 0530 05/31/19 0600 05/31/19 0606  BP: (!) 175/93 (!) 165/96 (!) 175/83   Pulse: (!) 115 (!) 119 (!) 118   Resp: (!) 28 (!) 31 (!) 21   Temp:      TempSrc:      SpO2: 96% 97% 96%   Weight:      Height:      PainSc:    5     Isolation Precautions Airborne and Contact precautions  Medications Medications  morphine 2 MG/ML injection 2 mg (2 mg Intravenous Given 05/31/19 0448)  dextromethorphan-guaiFENesin (MUCINEX DM) 30-600 MG per 12 hr tablet  1 tablet (has no administration in time range)  ipratropium (ATROVENT HFA) inhaler 2 puff (2 puffs Inhalation Not Given 05/31/19 0541)  levalbuterol (XOPENEX HFA) inhaler 2 puff (has no administration in time range)  cefTRIAXone (ROCEPHIN) 1 g in sodium chloride 0.9 % 100 mL IVPB (1 g Intravenous New Bag/Given 05/31/19 0606)  azithromycin (ZITHROMAX) tablet 500 mg (has no administration in time range)    Followed by  azithromycin (ZITHROMAX) tablet 250 mg (has no administration in time range)  vitamin C (ASCORBIC ACID) tablet 500 mg (has no administration in time range)  zinc sulfate capsule 220 mg (has no administration in time range)  methylPREDNISolone sodium succinate (SOLU-MEDROL) 40 mg/mL injection 40 mg (40 mg Intravenous Given 05/31/19 0600)  ondansetron (ZOFRAN) tablet 4 mg (has no administration in time range)    Or  ondansetron (ZOFRAN) injection 4 mg (has no administration in time range)  acetaminophen (TYLENOL) tablet 650 mg (650 mg Oral Given 05/31/19 0558)  hydrALAZINE (APRESOLINE) tablet 25 mg (has no administration in time range)  aspirin-acetaminophen-caffeine (EXCEDRIN MIGRAINE) per tablet 1 tablet (has no administration in time range)  heparin  injection 5,000 Units (has no administration in time range)  famotidine (PEPCID) IVPB 20 mg premix (has no administration in time range)  sodium chloride 0.9 % bolus 1,000 mL (0 mLs Intravenous Stopped 05/31/19 0301)  potassium chloride SA (KLOR-CON) CR tablet 60 mEq (60 mEq Oral Given 05/31/19 0446)  iohexol (OMNIPAQUE) 350 MG/ML injection 100 mL (100 mLs Intravenous Contrast Given 05/31/19 0423)    Mobility walks Low fall risk    R Recommendations: See Admitting Provider Note  Report given to:  Jannifer Hick Room 109

## 2019-05-31 NOTE — Plan of Care (Signed)
S.Ralph Leyden, RN

## 2019-05-31 NOTE — Progress Notes (Signed)
PROGRESS NOTE  Kari Blake [id number] DOB: 10-Jun-1966 DOA: 05/30/2019  PCP: Mayra Neer, MD  Brief History/Interval Summary: 53 y.o. female with medical history significant of hypertension, headache, who presented with cough, shortness breath, chest pain, fever, chills, abdominal pain, nausea, vomiting and diarrhea.  Patient was positive for COVID-19 on 9/25.  Patient underwent imaging studies which showed bilateral pulmonary opacities.  Patient was also complaining of abdominal discomfort and so she underwent a CT scan of the abdomen which did not reveal any acute findings.  Patient was placed on oxygen was subsequently transferred over to [facility].  Reason for Visit: Acute respiratory failure with hypoxia.  Pneumonia due to COVID-19.  Consultants: None  Procedures: None  Antibiotics: Anti-infectives (From admission, onward)   Start     Dose/Rate Route Frequency Ordered Stop   06/01/19 1000  azithromycin (ZITHROMAX) tablet 250 mg  Status:  Discontinued     250 mg Oral Daily 05/31/19 0527 05/31/19 0743   06/01/19 1000  remdesivir 100 mg in sodium chloride 0.9 % 250 mL IVPB     100 mg 500 mL/hr over 30 Minutes Intravenous Every 24 hours 05/31/19 1110 06/05/19 0959   05/31/19 1200  remdesivir 200 mg in sodium chloride 0.9 % 250 mL IVPB     200 mg 500 mL/hr over 30 Minutes Intravenous Once 05/31/19 1110     05/31/19 1000  azithromycin (ZITHROMAX) tablet 500 mg  Status:  Discontinued     500 mg Oral Daily 05/31/19 0527 05/31/19 0743   05/31/19 0530  cefTRIAXone (ROCEPHIN) 1 g in sodium chloride 0.9 % 100 mL IVPB  Status:  Discontinued     1 g 200 mL/hr over 30 Minutes Intravenous Every 24 hours 05/31/19 0527 05/31/19 0743      Subjective/Interval History: Patient states that she is feeling short of breath.  Complains of soreness in the chest when she takes a deep breath or when she coughs.  Some nausea earlier today and had vomiting last night but none since  then.  Cough is dry for the most part she did see a small amount of blood in the sputum this morning.   Assessment/Plan:  Acute Hypoxic Resp. Failure/Pneumonia due to COVID-19  COVID-19 Labs  Recent Labs    05/31/19 0550  DDIMER 2.14*  FERRITIN 429*  LDH 474*  CRP 27.3*    Lab Results  Component Value Date   SARSCOV2NAA POSITIVE (A) 05/31/2019   SARSCOV2NAA Detected (A) 05/21/2019     Fever: Noted to be afebrile Oxygen requirements: Requiring 3 L of oxygen by nasal cannula saturating in the early 90s. Antibacterials: Was ordered ceftriaxone and azithromycin but has been discontinued. Remdesivir: Day 1 Steroids: Solu-Medrol 40 mg twice a day Diuretics: None yet Actemra: Discussed with the patient.  See below.  1 dose today. Vitamin C and Zinc: Continue DVT Prophylaxis:  Lovenox 40 mg daily  Patient with a tenuous respiratory status.  She is requiring only 3 L of oxygen but her work of breathing seems to be high.  Respiratory rate is fast.  She is also tachycardic.  Currently afebrile.  She has extensive opacities in the lungs due to coronavirus.  She has significantly elevated CRP is 27.3.  Ferritin 429.  D-dimer 2.14.  Patient has been started on Remdesivir and steroids.  Actemra was also discussed along with convalescent plasma.  See below.  Patient to be given 1 dose of Actemra. Patient encouraged to stay in prone position as much as  possible.  Incentive spirometer.  Mobilization.  Procalcitonin noted to be less than 0.1.  No need for any antibacterial agents.  HIV nonreactive.  The treatment plan and use of medications and known side effects were discussed with patient. Some of the medications used are based on case reports/anecdotal data.  Steroids have shown some benefit in treatment of COVID-19 based on at least one study.  Another medication used is Actemra (Tocilizumab). However randomized control trials involving this drug have not shown any benefit although the final  reports have not been published yet.  Complete risks and long-term side effects are unknown, however in the best clinical judgment they seem to be of some benefit.  These were all discussed with the patient today.  Despite lack of benefit noted on preliminary reports from RCT's patient does want this medication knowing that this is considered experimental treatment.    Transaminitis This is most likely secondary to COVID-19.  Continue to trend.  CT scan of the abdomen pelvis did not show any acute findings.  Pleuritic chest pain CT scan did not show any PE.  Symptoms most likely due to COVID-19.  Nausea vomiting diarrhea and abdominal discomfort Symptoms most likely due to COVID-19.  No acute findings noted on abdomen.  Procalcitonin less than 0.1.  Soft diet.  Pepcid.  Hypokalemia Monitor potassium closely.  Was repleted yesterday.  Normal this morning.  History of essential hypertension Monitor blood pressure closely.  Hydralazine as needed.  History of headaches Continue to monitor.  Currently denies any headaches.  Morbid obesity Estimated body mass index is 34.01 kg/m as calculated from the following:   Height as of this encounter: 5\' 1"  (1.549 m).   Weight as of this encounter: 81.6 kg.  DVT Prophylaxis: Lovenox PUD Prophylaxis: Pepcid Code Status: Full code Family Communication: Discussed with the patient.  Will discuss with the daughter. Disposition Plan: Management as outlined above.  Mobilize as tolerated.  Hopefully will be able to return home when improved.   Medications:  Scheduled:  enoxaparin (LOVENOX) injection  40 mg Subcutaneous Daily   ipratropium  2 puff Inhalation Q4H   methylPREDNISolone (SOLU-MEDROL) injection  40 mg Intravenous Q12H   vitamin C  500 mg Oral Daily   zinc sulfate  220 mg Oral Daily   Continuous:  famotidine (PEPCID) IV 20 mg (05/31/19 1109)   remdesivir 200 mg in NS 250 mL     Followed by   Derrill Memo ON 06/01/2019] remdesivir 100  mg in NS 250 mL     tocilizumab (ACTEMRA) IV     [id number], aspirin-acetaminophen-caffeine, dextromethorphan-guaiFENesin, hydrALAZINE, levalbuterol, morphine injection, ondansetron **OR** ondansetron (ZOFRAN) IV   Objective:  Vital Signs  Vitals:   05/31/19 0600 05/31/19 0630 05/31/19 0900 05/31/19 0937  BP: (!) 175/83 (!) 169/92 (!) 158/84   Pulse: (!) 118 (!) 113 (!) 104   Resp: (!) 21 (!) 29 (!) 27   Temp:    99.1 F (37.3 C)  TempSrc:    Oral  SpO2: 96% 94% 97%   Weight:      Height:        Intake/Output Summary (Last 24 hours) at 05/31/2019 1153 Last data filed at 05/31/2019 0643 Gross per 24 hour  Intake 1100 ml  Output --  Net 1100 ml   Filed Weights   05/31/19 0026  Weight: 81.6 kg    General appearance: Awake alert.  In no distress Resp: Noted to be tachypneic.  Some use of accessory muscles noted.  Coarse breath sounds bilaterally with crackles bilaterally.  No wheezing or rhonchi.   Cardio: S1-S2 is normal regular.  No S3-S4.  No rubs murmurs or bruit GI: Abdomen is soft.  Nontender nondistended.  Bowel sounds are present normal.  No masses organomegaly Extremities: No edema.  Full range of motion of lower extremities. Neurologic: Alert and oriented x3.  No focal neurological deficits.    Lab Results:  Data Reviewed: I have personally reviewed following labs and imaging studies  CBC: Recent Labs  Lab 05/29/19 1304 05/31/19 0011  WBC 6.8 7.7  NEUTROABS 5.7  --   HGB 13.0 12.3  HCT 39.7 37.2  MCV 93.0 93.2  PLT 206 [id number]    Basic Metabolic Panel: Recent Labs  Lab 05/29/19 1304 05/31/19 0011 05/31/19 0707  NA 136 141 139  K 2.9* 3.0* 3.6  CL 105 109 108  CO2 18* 20* 19*  GLUCOSE 111* 94 110*  BUN 9 5* <5*  CREATININE 0.67 0.59 0.60  CALCIUM 8.6* 8.4* 8.1*    GFR: Estimated Creatinine Clearance: 79.6 mL/min (by C-G formula based on SCr of 0.6 mg/dL).  Liver Function Tests: Recent Labs  Lab 05/29/19 1304 05/31/19 0707  AST  143* 135*  ALT 168* 161*  ALKPHOS 133* 142*  BILITOT 1.0 0.8  PROT 7.9 7.5  ALBUMIN 3.7 3.1*    Recent Labs  Lab 05/31/19 0550  LIPASE 24     HbA1C: Recent Labs    05/31/19 0550  HGBA1C 5.7*     Lipid Profile: Recent Labs    05/31/19 0550  CHOL 174  HDL 42  LDLCALC 107*  TRIG 123  CHOLHDL 4.1     Anemia Panel: Recent Labs    05/31/19 0550  FERRITIN 429*    Recent Results (from the past 240 hour(s))  SARS Coronavirus 2 [facility] order, Performed in [facility] hospital lab) Nasopharyngeal Nasopharyngeal Swab     Status: Abnormal   Collection Time: 05/31/19 12:11 AM   Specimen: Nasopharyngeal Swab  Result Value Ref Range Status   SARS Coronavirus 2 POSITIVE (A) NEGATIVE Final    Comment: RESULT CALLED TO, READ BACK BY AND VERIFIED WITH: RHONEY,A AT 0155 ON 05/31/2019 BY JPM (NOTE) If result is NEGATIVE SARS-CoV-2 target nucleic acids are NOT DETECTED. The SARS-CoV-2 RNA is generally detectable in upper and lower  respiratory specimens during the acute phase of infection. The lowest  concentration of SARS-CoV-2 viral copies this assay can detect is 250  copies / mL. A negative result does not preclude SARS-CoV-2 infection  and should not be used as the sole basis for treatment or other  patient management decisions.  A negative result may occur with  improper specimen collection / handling, submission of specimen other  than nasopharyngeal swab, presence of viral mutation(s) within the  areas targeted by this assay, and inadequate number of viral copies  (<250 copies / mL). A negative result must be combined with clinical  observations, patient history, and epidemiological information. If result is POSITIVE SARS-CoV-2 target nucleic acids are DETECTED.  The SARS-CoV-2 RNA is generally detectable in upper and lower  respiratory specimens during the acute phase of infection.  Positive  results are indicative of active infection with SARS-CoV-2.  Clinical   correlation with patient history and other diagnostic information is  necessary to determine patient infection status.  Positive results do  not rule out bacterial infection or co-infection with other viruses. If result is PRESUMPTIVE POSTIVE SARS-CoV-2 nucleic acids MAY BE PRESENT.  A presumptive positive result was obtained on the submitted specimen  and confirmed on repeat testing.  While 2019 novel coronavirus  (SARS-CoV-2) nucleic acids may be present in the submitted sample  additional confirmatory testing may be necessary for epidemiological  and / or clinical management purposes  to differentiate between  SARS-CoV-2 and other Sarbecovirus currently known to infect humans.  If clinically indicated additional testing with an alternate test  methodology [phone]) i s advised. The SARS-CoV-2 RNA is generally  detectable in upper and lower respiratory specimens during the acute  phase of infection. The expected result is Negative. Fact Sheet for Patients:  StrictlyIdeas.no Fact Sheet for Healthcare Providers: BankingDealers.co.za This test is not yet approved or cleared by the [place] FDA and has been authorized for detection and/or diagnosis of SARS-CoV-2 by FDA under an Emergency Use Authorization (EUA).  This EUA will remain in effect (meaning this test can be used) for the duration of the COVID-19 declaration under Section 564(b)(1) of the Act, 21 U.S.C. section 360bbb-3(b)(1), unless the authorization is terminated or revoked sooner. Performed at [facility], [place] [address], [place], [place] [address]       Radiology Studies: Ct Angio Chest Pe W Or Wo Contrast  Result Date: 05/31/2019 CLINICAL DATA:  COVID-19.  Flank pain and burning with urination. EXAM: CT ANGIOGRAPHY CHEST CT ABDOMEN AND PELVIS WITH CONTRAST TECHNIQUE: Multidetector CT imaging of the chest was performed using the standard  protocol during bolus administration of intravenous contrast. Multiplanar CT image reconstructions and MIPs were obtained to evaluate the vascular anatomy. Multidetector CT imaging of the abdomen and pelvis was performed using the standard protocol during bolus administration of intravenous contrast. CONTRAST:  171mL OMNIPAQUE IOHEXOL 350 MG/ML SOLN COMPARISON:  Renal stone study 09/25/2016. Chest x-ray from 2 days ago FINDINGS: CTA CHEST FINDINGS Cardiovascular: Normal heart size. No pericardial effusion. Significant motion artifact with no evidence of pulmonary embolism. Negative aorta. Mediastinum/Nodes: Partially covered goiter.  No adenopathy Lungs/Pleura: Patchy ground-glass opacity in the bilateral lungs correlating with history of COVID-19 positivity. The opacity is extensive. No superimposed edema or pleural fluid Musculoskeletal: Generalized spondylosis. Review of the MIP images confirms the above findings. CT ABDOMEN and PELVIS FINDINGS Hepatobiliary: No focal liver abnormality.No evidence of biliary obstruction or stone. Pancreas: Unremarkable. Spleen: Unremarkable. Adrenals/Urinary Tract: Negative adrenals. No hydronephrosis or ureteral stone. 5 mm left lower pole calculus. A small right renal calculus is vascular. Unremarkable bladder. Stomach/Bowel:  No obstruction. No evidence of bowel inflammation. Vascular/Lymphatic: No acute vascular abnormality. No mass or adenopathy. Reproductive:Enlarged uterus from fundic mass which is heterogeneously enhancing and measures up to nearly 10 cm in diameter, appearance similar to prior. The uterus reaches the umbilicus. Other: No ascites or pneumoperitoneum. Small fatty umbilical hernia. Musculoskeletal: No acute abnormalities. Generalized moderate lumbar spine degeneration Review of the MIP images confirms the above findings. IMPRESSION: Chest CTA: 1. Extensive ground-glass pulmonary opacity that correlates well with COVID-19 positivity. 2. Significant motion  degradation with no evidence of pulmonary embolism. Abdominal CT: 1. No acute finding. 2. Chronic uterine mass attributed to a 10 cm fibroid. 3. Nonobstructive left renal calculus. Electronically Signed   By: Monte Fantasia M.D.   On: 05/31/2019 04:50   Ct Abdomen Pelvis W Contrast  Result Date: 05/31/2019 CLINICAL DATA:  COVID-19.  Flank pain and burning with urination. EXAM: CT ANGIOGRAPHY CHEST CT ABDOMEN AND PELVIS WITH CONTRAST TECHNIQUE: Multidetector CT imaging of the chest was performed using the standard protocol during bolus administration  of intravenous contrast. Multiplanar CT image reconstructions and MIPs were obtained to evaluate the vascular anatomy. Multidetector CT imaging of the abdomen and pelvis was performed using the standard protocol during bolus administration of intravenous contrast. CONTRAST:  174mL OMNIPAQUE IOHEXOL 350 MG/ML SOLN COMPARISON:  Renal stone study 09/25/2016. Chest x-ray from 2 days ago FINDINGS: CTA CHEST FINDINGS Cardiovascular: Normal heart size. No pericardial effusion. Significant motion artifact with no evidence of pulmonary embolism. Negative aorta. Mediastinum/Nodes: Partially covered goiter.  No adenopathy Lungs/Pleura: Patchy ground-glass opacity in the bilateral lungs correlating with history of COVID-19 positivity. The opacity is extensive. No superimposed edema or pleural fluid Musculoskeletal: Generalized spondylosis. Review of the MIP images confirms the above findings. CT ABDOMEN and PELVIS FINDINGS Hepatobiliary: No focal liver abnormality.No evidence of biliary obstruction or stone. Pancreas: Unremarkable. Spleen: Unremarkable. Adrenals/Urinary Tract: Negative adrenals. No hydronephrosis or ureteral stone. 5 mm left lower pole calculus. A small right renal calculus is vascular. Unremarkable bladder. Stomach/Bowel:  No obstruction. No evidence of bowel inflammation. Vascular/Lymphatic: No acute vascular abnormality. No mass or adenopathy.  Reproductive:Enlarged uterus from fundic mass which is heterogeneously enhancing and measures up to nearly 10 cm in diameter, appearance similar to prior. The uterus reaches the umbilicus. Other: No ascites or pneumoperitoneum. Small fatty umbilical hernia. Musculoskeletal: No acute abnormalities. Generalized moderate lumbar spine degeneration Review of the MIP images confirms the above findings. IMPRESSION: Chest CTA: 1. Extensive ground-glass pulmonary opacity that correlates well with COVID-19 positivity. 2. Significant motion degradation with no evidence of pulmonary embolism. Abdominal CT: 1. No acute finding. 2. Chronic uterine mass attributed to a 10 cm fibroid. 3. Nonobstructive left renal calculus. Electronically Signed   By: Monte Fantasia M.D.   On: 05/31/2019 04:50   Dg Chest Port 1 View  Result Date: 05/29/2019 CLINICAL DATA:  Shortness of breath EXAM: PORTABLE CHEST 1 VIEW COMPARISON:  08/16/2014 FINDINGS: Low lung volumes. Right lower lobe opacity, suspicious for pneumonia, less likely atelectasis. Increased interstitial markings. No pleural effusion or pneumothorax. The heart is normal in size. IMPRESSION: Right lower lobe opacity, suspicious for pneumonia, less likely atelectasis. Increased interstitial markings may reflect vascular crowding in the setting of low lung volumes but are considered worrisome for mild multifocal infection. Electronically Signed   By: Julian Hy M.D.   On: 05/29/2019 13:49       LOS: 0 days   [facility] Pager on [email or web address]  05/31/2019, 11:53 AM

## 2019-05-31 NOTE — ED Notes (Signed)
Pt placed on 3L O2

## 2019-05-31 NOTE — ED Notes (Signed)
[facility] called for transport. 

## 2019-05-31 NOTE — H&P (Addendum)
History and Physical    Kari Blake [id number] DOB: 12-20-1965 DOA: 05/30/2019  Referring MD/NP/PA:   PCP: Mayra Neer, MD   Patient coming from:  The patient is coming from home.  At baseline, pt is independent for most of ADL.        Chief Complaint: Cough, shortness of breath, fever, chest pain, chills, abdominal pain, nausea, vomiting and diarrhea  HPI: Kari Blake is a 53 y.o. female with medical history significant of hypertension, headache, who presents with cough, shortness breath, chest pain, fever, chills, abdominal pain, nausea, vomiting and diarrhea.  Patient had a positive COVID-19 test on 9/25.  She states that she continues to have cough, shortness of breath, fever and chills.  No runny nose or sore throat.  She coughs up rusty sputum.  She also reports chest pain, which is located in central chest, 7 out of 10 severity, pleuritic, aggravated by coughing.  Denies tenderness in the calf areas.  Patient also reports nausea and several times of vomiting with nonbilious nonbloody vomitus.  She had diarrhea yesterday, no diarrhea today.  Patient has abdominal pain, which is located in the central abdomen, constant, sharp, 6 out of 10 severity, nonradiating.  Patient reports dysuria, burning on urination and increased urinary frequency.  No unilateral weakness.  ED Course: pt was found to have WBC 7.7, negative pregnancy test, positive COVID-19 test, negative urinalysis, potassium 3.0, renal function normal, temperature 100.7, blood pressure 138/89, tachycardia, tachypnea, oxygen saturation 89% on room air.  Chest x-ray showed right lower lobe opacity and mild multifocal infiltration.  Patient is admitted to telemetry bed as inpatient.  Review of Systems:   General: has fevers, chills, no body weight gain, has poor appetite, has fatigue HEENT: no blurry vision, hearing changes or sore throat Respiratory: has dyspnea, coughing, no wheezing CV: has chest pain, no  palpitations GI: has nausea, vomiting, abdominal pain, diarrhea, no constipation GU: has dysuria, burning on urination, increased urinary frequency, no hematuria  Ext: no leg edema Neuro: no unilateral weakness, numbness, or tingling, no vision change or hearing loss Skin: no rash, no skin tear. MSK: No muscle spasm, no deformity, no limitation of range of movement in spin Heme: No easy bruising.  Travel history: No recent long distant travel.  Allergy: No Known Allergies  Past Medical History:  Diagnosis Date  . Fibroid   . Headache   . Hypertension   . Trichomonas infection     Past Surgical History:  Procedure Laterality Date  . CESAREAN SECTION    . EYE SURGERY    . TUBAL LIGATION      Social History:  reports that she has never smoked. She has never used smokeless tobacco. She reports current alcohol use. She reports that she does not use drugs.  Family History:  Family History  Problem Relation Age of Onset  . Obesity Other   . Hypertension Other   . Glaucoma Other   . Cancer Other      Prior to Admission medications   Medication Sig Start Date End Date Taking? Authorizing Provider  Aspirin-Acetaminophen-Caffeine (EXCEDRIN MIGRAINE PO) Take 2 tablets by mouth daily as needed (headache).    Yes [provider]  benzonatate (TESSALON) 100 MG capsule Take 1 capsule (100 mg total) by mouth 3 (three) times daily as needed for cough. 05/29/19  Yes Julianne Rice, MD  ibuprofen (ADVIL) 200 MG tablet Take 400 mg by mouth every 6 (six) hours as needed for headache or moderate  pain.   Yes [provider]  ondansetron (ZOFRAN ODT) 4 MG disintegrating tablet 4mg  ODT q4 hours prn nausea/vomit 05/29/19  Yes Julianne Rice, MD  potassium chloride (KLOR-CON) 10 MEQ tablet Take 1 tablet (10 mEq total) by mouth daily. 05/30/19  Yes Julianne Rice, MD  pseudoephedrine-acetaminophen (TYLENOL SINUS) 30-500 MG TABS tablet Take 2 tablets by mouth every 6 (six) hours  as needed (congestion).    Yes [provider]  Pseudoephedrine-APAP-DM (DAYQUIL MULTI-SYMPTOM COLD/FLU PO) Take 30 mLs by mouth every 6 (six) hours as needed (congestion).   Yes [provider]  ibuprofen (ADVIL,MOTRIN) 800 MG tablet Take 1 tablet (800 mg total) by mouth every 8 (eight) hours as needed. Patient not taking: Reported on [id number]   Delora Fuel, MD  meloxicam (MOBIC) 15 MG tablet Take 1 tablet (15 mg total) daily by mouth. TAKE WITH MEALS Patient not taking: Reported on 04/29/2019 06/30/17   Street, [place], PA-C  sulfamethoxazole-trimethoprim (BACTRIM DS,SEPTRA DS) 800-160 MG tablet Take 1 tablet by mouth 2 (two) times daily. Patient not taking: Reported on 04/29/2019 12/27/17   Luvenia Redden, PA-C    Physical Exam: Vitals:   05/31/19 0130 05/31/19 0309 05/31/19 0315 05/31/19 0415  BP: 138/89 (!) 157/98  (!) 171/99  Pulse: (!) 110 (!) 118  (!) 123  Resp: (!) 30 20  (!) 32  Temp:      TempSrc:      SpO2: 90% (!) 86% 94% 98%  Weight:      Height:       General: Not in acute distress HEENT:       Eyes: PERRL, EOMI, no scleral icterus.       ENT: No discharge from the ears and nose, no pharynx injection, no tonsillar enlargement.        Neck: No JVD, no bruit, no mass felt. Heme: No neck lymph node enlargement. Cardiac: S1/S2, RRR, No murmurs, No gallops or rubs. Respiratory: has fine crackle in right base. GI: Soft, nondistended, has tenderness in central abdomen, no rebound pain, no organomegaly, BS present. GU: No hematuria Ext: No pitting leg edema bilaterally. 2+DP/PT pulse bilaterally. Musculoskeletal: No joint deformities, No joint redness or warmth, no limitation of ROM in spin. Skin: No rashes.  Neuro: Alert, oriented X3, cranial nerves II-XII grossly intact, moves all extremities normally.  Psych: Patient is not psychotic, no suicidal or hemocidal ideation.  Labs on Admission: I have personally reviewed following labs and imaging  studies  CBC: Recent Labs  Lab 05/29/19 1304 05/31/19 0011  WBC 6.8 7.7  NEUTROABS 5.7  --   HGB 13.0 12.3  HCT 39.7 37.2  MCV 93.0 93.2  PLT 206 [id number]   Basic Metabolic Panel: Recent Labs  Lab 05/29/19 1304 05/31/19 0011  NA 136 141  K 2.9* 3.0*  CL 105 109  CO2 18* 20*  GLUCOSE 111* 94  BUN 9 5*  CREATININE 0.67 0.59  CALCIUM 8.6* 8.4*   GFR: Estimated Creatinine Clearance: 79.6 mL/min (by C-G formula based on SCr of 0.59 mg/dL). Liver Function Tests: Recent Labs  Lab 05/29/19 1304  AST 143*  ALT 168*  ALKPHOS 133*  BILITOT 1.0  PROT 7.9  ALBUMIN 3.7   No results for input(s): LIPASE, AMYLASE in the last 168 hours. No results for input(s): AMMONIA in the last 168 hours. Coagulation Profile: No results for input(s): INR, PROTIME in the last 168 hours. Cardiac Enzymes: No results for input(s): CKTOTAL, CKMB, CKMBINDEX, TROPONINI in the last  168 hours. BNP (last 3 results) No results for input(s): PROBNP in the last 8760 hours. HbA1C: No results for input(s): HGBA1C in the last 72 hours. CBG: No results for input(s): GLUCAP in the last 168 hours. Lipid Profile: No results for input(s): CHOL, HDL, LDLCALC, TRIG, CHOLHDL, LDLDIRECT in the last 72 hours. Thyroid Function Tests: No results for input(s): TSH, T4TOTAL, FREET4, T3FREE, THYROIDAB in the last 72 hours. Anemia Panel: No results for input(s): VITAMINB12, FOLATE, FERRITIN, TIBC, IRON, RETICCTPCT in the last 72 hours. Urine analysis:    Component Value Date/Time   COLORURINE YELLOW 05/30/2019 2220   APPEARANCEUR HAZY (A) 05/30/2019 2220   LABSPEC 1.023 05/30/2019 2220   PHURINE 5.0 05/30/2019 2220   GLUCOSEU NEGATIVE 05/30/2019 2220   HGBUR NEGATIVE 05/30/2019 2220   BILIRUBINUR NEGATIVE 05/30/2019 2220   KETONESUR 80 (A) 05/30/2019 2220   PROTEINUR 100 (A) 05/30/2019 2220   NITRITE NEGATIVE 05/30/2019 2220   LEUKOCYTESUR NEGATIVE 05/30/2019 2220   Sepsis Labs:  @LABRCNTIP (procalcitonin:4,lacticidven:4) ) Recent Results (from the past 240 hour(s))  SARS Coronavirus 2 [facility] order, Performed in [facility] hospital lab) Nasopharyngeal Nasopharyngeal Swab     Status: Abnormal   Collection Time: 05/31/19 12:11 AM   Specimen: Nasopharyngeal Swab  Result Value Ref Range Status   SARS Coronavirus 2 POSITIVE (A) NEGATIVE Final    Comment: RESULT CALLED TO, READ BACK BY AND VERIFIED WITH: RHONEY,A AT 0155 ON 05/31/2019 BY JPM (NOTE) If result is NEGATIVE SARS-CoV-2 target nucleic acids are NOT DETECTED. The SARS-CoV-2 RNA is generally detectable in upper and lower  respiratory specimens during the acute phase of infection. The lowest  concentration of SARS-CoV-2 viral copies this assay can detect is 250  copies / mL. A negative result does not preclude SARS-CoV-2 infection  and should not be used as the sole basis for treatment or other  patient management decisions.  A negative result may occur with  improper specimen collection / handling, submission of specimen other  than nasopharyngeal swab, presence of viral mutation(s) within the  areas targeted by this assay, and inadequate number of viral copies  (<250 copies / mL). A negative result must be combined with clinical  observations, patient history, and epidemiological information. If result is POSITIVE SARS-CoV-2 target nucleic acids are DETECTED.  The SARS-CoV-2 RNA is generally detectable in upper and lower  respiratory specimens during the acute phase of infection.  Positive  results are indicative of active infection with SARS-CoV-2.  Clinical  correlation with patient history and other diagnostic information is  necessary to determine patient infection status.  Positive results do  not rule out bacterial infection or co-infection with other viruses. If result is PRESUMPTIVE POSTIVE SARS-CoV-2 nucleic acids MAY BE PRESENT.   A presumptive positive result was obtained on the submitted  specimen  and confirmed on repeat testing.  While 2019 novel coronavirus  (SARS-CoV-2) nucleic acids may be present in the submitted sample  additional confirmatory testing may be necessary for epidemiological  and / or clinical management purposes  to differentiate between  SARS-CoV-2 and other Sarbecovirus currently known to infect humans.  If clinically indicated additional testing with an alternate test  methodology [phone]) i s advised. The SARS-CoV-2 RNA is generally  detectable in upper and lower respiratory specimens during the acute  phase of infection. The expected result is Negative. Fact Sheet for Patients:  StrictlyIdeas.no Fact Sheet for Healthcare Providers: BankingDealers.co.za This test is not yet approved or cleared by the [place] FDA and  has been authorized for detection and/or diagnosis of SARS-CoV-2 by FDA under an Emergency Use Authorization (EUA).  This EUA will remain in effect (meaning this test can be used) for the duration of the COVID-19 declaration under Section 564(b)(1) of the Act, 21 U.S.C. section 360bbb-3(b)(1), unless the authorization is terminated or revoked sooner. Performed at [facility], [place] [address], [place], [place] [address]      Radiological Exams on Admission: Dg Chest Port 1 View  Result Date: 05/29/2019 CLINICAL DATA:  Shortness of breath EXAM: PORTABLE CHEST 1 VIEW COMPARISON:  08/16/2014 FINDINGS: Low lung volumes. Right lower lobe opacity, suspicious for pneumonia, less likely atelectasis. Increased interstitial markings. No pleural effusion or pneumothorax. The heart is normal in size. IMPRESSION: Right lower lobe opacity, suspicious for pneumonia, less likely atelectasis. Increased interstitial markings may reflect vascular crowding in the setting of low lung volumes but are considered worrisome for mild multifocal infection. Electronically Signed   By: Julian Hy M.D.   On: 05/29/2019 13:49     EKG: Not done in ED, will get one.   Assessment/Plan Principal Problem:   Acute respiratory disease due to COVID-19 virus Active Problems:   Hypertension   Sepsis ([place])   Hypokalemia   Nausea vomiting and diarrhea   Acute respiratory failure with hypoxia (HCC)   Abdominal pain   Chest pain   COVID-19 virus infection   Headache   Acute respiratory failure with hypoxia due to COVID-19 virus infection and sepsis: Patient has oxygen desaturation to 89% on room air, positive chest x-ray for multifocal infiltration in the right lower lobe opacity.  She has productive cough.  Cannot completely rule out pneumonia.  Patient meets critical for sepsis with fever, tachycardia and tachypnea.  Currently hemodynamically stable.  -will admit to tele as inpt -Remdesivir per pharm -start Rocephin and Z-Pak -Solumedrol 40 mg bid -vitamin C, zinc.   -Atrovent inhaler, PRN Xopenex inhaler -PRN Mucinex for cough -Follow-up flu PCR and RVP, urine antigen of Legionella and strep -f/u Blood culture -D-dimer, BNP,Trop, LFT, CRP, LDH, Procalcitonin, IL-6, ferritin, fibinogen, TG, Hep B SAg, HIV ab -Daily CRP, Ferritin, D-dimer, -Will ask the patient to maintain an awake prone position for 16+ hours a day, if possible, with a minimum of 2-3 hours at a time -Patient was seen wearing full PPE including: gown, gloves, head cover, N95 -will get Procalcitonin and trend lactic acid levels per sepsis protocol. -IVF: pt received 1 L normal saline bolus in ED, will hold off further IV fluid and attempt to maintain euvolemia to a net negative fluid status -IF patient deteriorates, will consult PCCM and ID  Chest pain: Patient has a pleuritic chest pain.  Possibly due to COVID-19 infection with multifocal infiltration or possible pneumonia, but still need to rule out PE.  -Follow-up CT angiogram - trend trop - F/u EKG  Hypokalemia: K=3.0 on admission. - Repleted -  Check Mg level - Give 1 g of magnesium sulfate  Hypertension: Bp 138/89. Not taking meds now -prn hydralazine orally  Nausea vomiting and diarrhea and abdominal pain: Her GI symptoms are at least partially due to COVID-19 infection, but she seems to have significant abdominal tenderness on physical examination.  Patient is taking ibuprofen, Mobic and Excedrin for headache. She may have gastric ulcer or gastritis.  Will need to rule out acute abdomen. - Check lipase -Follow-up CT abdomen/pelvis. -As needed morphine for pain and Zofran for nausea -pepcid IV  Headache: -continue home Excedrin  Inpatient status:  # Patient requires inpatient status due to high intensity of service, high risk for further deterioration and high frequency of surveillance required.  I certify that at the point of admission it is my clinical judgment that the patient will require inpatient hospital care spanning beyond 2 midnights from the point of admission.  . Now patient has presenting symptoms include acute respiratory failure with hypoxia due to COVID-19 virus infection and sepsis, chest pain, abdominal pain, diarrhea, nausea and vomiting . The worrisome physical exam findings include central abdominal tenderness . The initial radiographic and laboratory data are worrisome because of positive COVID-19 test, hypokalemia, chest x-ray showed multifocal infiltration . Current medical needs: please see my assessment and plan . Predictability of an adverse outcome (risk): Patient has complicated presentation, including acute respiratory failure with hypoxia due to COVID-19 virus infection and sepsis, chest pain, abdominal pain, diarrhea, nausea and vomiting.  Will need to rule out acute abdomen and pulmonary embolism.  Her presentation is highly complicated.  Patient is at high risk for deteriorating.  Will need to be treated in hospital for at least 2 days. .          DVT ppx: SQ Heparin     Code Status: Full  code Family Communication: None at bed side.     Disposition Plan:  Anticipate discharge back to previous home environment Consults called:  none Admission status:   Inpatient/tele     Date of Service 05/31/2019    [facility]   If 7PM-7AM, please contact night-coverage [email or web address] Password [email or web address] 05/31/2019, 4:42 AM

## 2019-05-31 NOTE — Progress Notes (Signed)
Remdesivir - Pharmacy Brief Note   O:  ALT: remains elevated at 161 (<220) CT:  Extensive ground-glass pulmonary opacity that correlates well with COVID-19 positivity. SpO2: 97% on 3L [place]   A/P:  Remdesivir 200 mg once followed by 100 mg daily x 4 days.   Gretta Arab PharmD, BCPS Clinical pharmacist phone 7am- 5pm: [phone] 05/31/2019 7:05 AM

## 2019-05-31 NOTE — ED Notes (Addendum)
Documented in error.

## 2019-05-31 NOTE — ED Notes (Signed)
Report given to [facility]. 

## 2019-05-31 NOTE — ED Provider Notes (Signed)
[facility] DEPT Provider Note   CSN: [id number] Arrival date & time: 05/30/19  2145     History   Chief Complaint Chief Complaint  Patient presents with  . COVID +  . Flank Pain    HPI ERIE COORS is a 53 y.o. female presenting for evaluation of shortness of breath, back pain, and dysuria.  Patient states she has a positive for COVID 10 days ago.  She was seen in the ER for COVID yesterday, was found to have multifocal pneumonia likely due to COVID and hypokalemia.  Potassium was replenished and patient felt better after medications.  However, patient reports worsening shortness of breath today.  She also reports bilateral flank pain, dysuria, and dark urine.  She has been taking Zofran, but reports she is still having emesis, 3 episodes since yesterday.  She has no medical problems, takes no medications daily.     HPI  Past Medical History:  Diagnosis Date  . Fibroid   . Headache   . Hypertension   . Trichomonas infection     Patient Active Problem List   Diagnosis Date Noted  . Abdominal pain 05/31/2019  . Chest pain 05/31/2019  . COVID-19 virus infection 05/31/2019  . Hypertension   . Acute respiratory disease due to COVID-19 virus   . Sepsis ([place])   . Hypokalemia   . Nausea vomiting and diarrhea   . Acute respiratory failure with hypoxia ([place])   . Pes planus 04/29/2019    Past Surgical History:  Procedure Laterality Date  . CESAREAN SECTION    . EYE SURGERY    . TUBAL LIGATION       OB History    Gravida  6   Para  2   Term  2   Preterm      AB  4   Living  2     SAB      TAB  4   Ectopic      Multiple      Live Births               Home Medications    Prior to Admission medications   Medication Sig Start Date End Date Taking? Authorizing Provider  Aspirin-Acetaminophen-Caffeine (EXCEDRIN MIGRAINE PO) Take 2 tablets by mouth daily as needed (headache).    Yes [provider]   benzonatate (TESSALON) 100 MG capsule Take 1 capsule (100 mg total) by mouth 3 (three) times daily as needed for cough. 05/29/19  Yes Julianne Rice, MD  ibuprofen (ADVIL) 200 MG tablet Take 400 mg by mouth every 6 (six) hours as needed for headache or moderate pain.   Yes [provider]  ondansetron (ZOFRAN ODT) 4 MG disintegrating tablet 4mg  ODT q4 hours prn nausea/vomit 05/29/19  Yes Julianne Rice, MD  potassium chloride (KLOR-CON) 10 MEQ tablet Take 1 tablet (10 mEq total) by mouth daily. 05/30/19  Yes Julianne Rice, MD  pseudoephedrine-acetaminophen (TYLENOL SINUS) 30-500 MG TABS tablet Take 2 tablets by mouth every 6 (six) hours as needed (congestion).    Yes [provider]  Pseudoephedrine-APAP-DM (DAYQUIL MULTI-SYMPTOM COLD/FLU PO) Take 30 mLs by mouth every 6 (six) hours as needed (congestion).   Yes [provider]  ibuprofen (ADVIL,MOTRIN) 800 MG tablet Take 1 tablet (800 mg total) by mouth every 8 (eight) hours as needed. Patient not taking: Reported on [id number]   Delora Fuel, MD  meloxicam (MOBIC) 15 MG tablet Take 1 tablet (15 mg total)  daily by mouth. TAKE WITH MEALS Patient not taking: Reported on 04/29/2019 06/30/17   Street, [place], PA-C  sulfamethoxazole-trimethoprim (BACTRIM DS,SEPTRA DS) 800-160 MG tablet Take 1 tablet by mouth 2 (two) times daily. Patient not taking: Reported on 04/29/2019 12/27/17   Luvenia Redden, PA-C    Family History Family History  Problem Relation Age of Onset  . Obesity Other   . Hypertension Other   . Glaucoma Other   . Cancer Other     Social History Social History   Tobacco Use  . Smoking status: Never Smoker  . Smokeless tobacco: Never Used  Substance Use Topics  . Alcohol use: Yes    Comment: rare  . Drug use: No     Allergies   Patient has no known allergies.   Review of Systems Review of Systems  Constitutional: Positive for fever.  Respiratory: Positive for shortness of breath.    Gastrointestinal: Positive for nausea and vomiting.  Genitourinary: Positive for dysuria and flank pain.  Musculoskeletal: Positive for myalgias.  Neurological: Positive for headaches.  All other systems reviewed and are negative.    Physical Exam Updated Vital Signs BP (!) 157/98   Pulse (!) 118   Temp 100.2 F (37.9 C) (Oral)   Resp 20   Ht 5\' 1"  (1.549 m)   Wt 81.6 kg   SpO2 94%   BMI 34.01 kg/m   Physical Exam Vitals signs and nursing note reviewed.  Constitutional:      Appearance: She is well-developed. She is ill-appearing.     Comments: Appears ill  HENT:     Head: Normocephalic and atraumatic.  Eyes:     Conjunctiva/sclera: Conjunctivae normal.     Pupils: Pupils are equal, round, and reactive to light.  Neck:     Musculoskeletal: Normal range of motion and neck supple.  Cardiovascular:     Rate and Rhythm: Regular rhythm. Tachycardia present.     Comments: Tachycardic around 115 Pulmonary:     Effort: Pulmonary effort is normal. Tachypnea present.     Comments: Speaking in short sentences. tachypneic around 30.  Pt ambulated in room, SpO2 87% and tachypneic to 40.  Abdominal:     General: There is no distension.     Palpations: Abdomen is soft. There is no mass.     Tenderness: There is no abdominal tenderness. There is no guarding or rebound.     Comments: Bilateral flank pain. No ttp of anterior abd. No rigidity, guarding, distention  Musculoskeletal: Normal range of motion.  Skin:    General: Skin is warm and dry.     Capillary Refill: Capillary refill takes less than 2 seconds.  Neurological:     Mental Status: She is alert and oriented to person, place, and time.      ED Treatments / Results  Labs (all labs ordered are listed, but only abnormal results are displayed) Labs Reviewed  SARS CORONAVIRUS 2 ([facility]) - Abnormal; Notable for the following components:      Result Value   SARS Coronavirus  2 POSITIVE (*)    All other components within normal limits  URINALYSIS, ROUTINE W REFLEX MICROSCOPIC - Abnormal; Notable for the following components:   APPearance HAZY (*)    Ketones, ur 80 (*)    Protein, ur 100 (*)    All other components within normal limits  BASIC METABOLIC PANEL - Abnormal; Notable for the following components:   Potassium 3.0 (*)  CO2 20 (*)    BUN 5 (*)    Calcium 8.4 (*)    All other components within normal limits  CBC  I-STAT BETA HCG BLOOD, ED ([facility], [facility], [facility] ONLY)    EKG None  Radiology Dg Chest Port 1 View  Result Date: 05/29/2019 CLINICAL DATA:  Shortness of breath EXAM: PORTABLE CHEST 1 VIEW COMPARISON:  08/16/2014 FINDINGS: Low lung volumes. Right lower lobe opacity, suspicious for pneumonia, less likely atelectasis. Increased interstitial markings. No pleural effusion or pneumothorax. The heart is normal in size. IMPRESSION: Right lower lobe opacity, suspicious for pneumonia, less likely atelectasis. Increased interstitial markings may reflect vascular crowding in the setting of low lung volumes but are considered worrisome for mild multifocal infection. Electronically Signed   By: Julian Hy M.D.   On: 05/29/2019 13:49    Procedures Procedures (including critical care time)  Medications Ordered in ED Medications  potassium chloride SA (KLOR-CON) CR tablet 60 mEq (has no administration in time range)  morphine 2 MG/ML injection 2 mg (has no administration in time range)  dextromethorphan-guaiFENesin (MUCINEX DM) 30-600 MG per 12 hr tablet 1 tablet (has no administration in time range)  ipratropium (ATROVENT HFA) inhaler 2 puff (has no administration in time range)  levalbuterol (XOPENEX HFA) inhaler 2 puff (has no administration in time range)  sodium chloride 0.9 % bolus 1,000 mL (0 mLs Intravenous Stopped 05/31/19 0301)     Initial Impression / Assessment and Plan / ED Course  I have reviewed the triage vital signs and the nursing  notes.  Pertinent labs & imaging results that were available during my care of the patient were reviewed by me and considered in my medical decision making (see chart for details).        Pt presenting for evaluation of sob, back pain, urinary sxs, and HA.  Exam concerning, patient appears ill.  She is speaking in short sentences, tachycardic, and tachypneic.  Upon ambulation, sats dropped to 87 and patient becomes more tachypneic.  Yesterday had x-ray which showed multifocal pneumonia likely due to [place].  Will obtain lab work to assess kidney function as well as urine to rule out infection.  However I am concerned patient may need to be admitted for coronavirus.  Labs show slight hypokalemia at 3, otherwise reassuring.  Urine without obvious infection.  Coated positive.  Will call for admission.  Discussed with Dr. Blaine Hamper from [facility] hospitalist service, patient to be admitted.  Kari Blake was evaluated in Emergency Department on 05/31/2019 for the symptoms described in the history of present illness. She was evaluated in the context of the global COVID-19 pandemic, which necessitated consideration that the patient might be at risk for infection with the SARS-CoV-2 virus that causes COVID-19. Institutional protocols and algorithms that pertain to the evaluation of patients at risk for COVID-19 are in a state of rapid change based on information released by regulatory bodies including the CDC and federal and state organizations. These policies and algorithms were followed during the patient's care in the ED.  Final Clinical Impressions(s) / ED Diagnoses   Final diagnoses:  [id number]    ED Discharge Orders    None       Franchot Heidelberg, PA-C 05/31/19 [place], April, MD 05/31/19 [phone]

## 2019-05-31 NOTE — ED Notes (Signed)
[facility] arrived  

## 2019-06-01 ENCOUNTER — Ambulatory Visit: Payer: BLUE CROSS/BLUE SHIELD | Admitting: Sports Medicine

## 2019-06-01 DIAGNOSIS — R7401 Elevation of levels of liver transaminase levels: Secondary | ICD-10-CM

## 2019-06-01 DIAGNOSIS — J1289 Other viral pneumonia: Secondary | ICD-10-CM

## 2019-06-01 LAB — COMPREHENSIVE METABOLIC PANEL
ALT: 140 U/L — ABNORMAL HIGH (ref 0–44)
AST: 108 U/L — ABNORMAL HIGH (ref 15–41)
Albumin: 2.9 g/dL — ABNORMAL LOW (ref 3.5–5.0)
Alkaline Phosphatase: 133 U/L — ABNORMAL HIGH (ref 38–126)
Anion gap: 12 (ref 5–15)
BUN: 12 mg/dL (ref 6–20)
CO2: 22 mmol/L (ref 22–32)
Calcium: 8.4 mg/dL — ABNORMAL LOW (ref 8.9–10.3)
Chloride: 106 mmol/L (ref 98–111)
Creatinine, Ser: 0.7 mg/dL (ref 0.44–1.00)
GFR calc Af Amer: >60 mL/min (ref >60)
GFR calc non Af Amer: >60 mL/min (ref >60)
Glucose, Bld: 117 mg/dL — ABNORMAL HIGH (ref 70–99)
Potassium: 3.5 mmol/L (ref 3.5–5.1)
Sodium: 140 mmol/L (ref 135–145)
Total Bilirubin: 0.6 mg/dL (ref 0.3–1.2)
Total Protein: 7.2 g/dL (ref 6.5–8.1)

## 2019-06-01 LAB — CBC WITH DIFFERENTIAL/PLATELET
Abs Immature Granulocytes: 0.04 10*3/uL (ref 0.00–0.07)
Basophils Absolute: 0 10*3/uL (ref 0.0–0.1)
Basophils Relative: 1 %
Eosinophils Absolute: 0 10*3/uL (ref 0.0–0.5)
Eosinophils Relative: 0 %
HCT: 36.5 % (ref 36.0–46.0)
Hemoglobin: 11.8 g/dL — ABNORMAL LOW (ref 12.0–15.0)
Immature Granulocytes: 1 %
Lymphocytes Relative: 22 %
Lymphs Abs: 1 10*3/uL (ref 0.7–4.0)
MCH: 30.1 pg (ref 26.0–34.0)
MCHC: 32.3 g/dL (ref 30.0–36.0)
MCV: 93.1 fL (ref 80.0–100.0)
Monocytes Absolute: 0.3 10*3/uL (ref 0.1–1.0)
Monocytes Relative: 7 %
Neutro Abs: 3 10*3/uL (ref 1.7–7.7)
Neutrophils Relative %: 69 %
Platelets: 287 10*3/uL (ref 150–400)
RBC: 3.92 MIL/uL (ref 3.87–5.11)
RDW: 13.6 % (ref 11.5–15.5)
WBC: 4.3 10*3/uL (ref 4.0–10.5)
nRBC: 0 % (ref 0.0–0.2)

## 2019-06-01 LAB — TYPE AND SCREEN
ABO/RH(D): A POS
Antibody Screen: NEGATIVE

## 2019-06-01 LAB — FERRITIN: Ferritin: 410 ng/mL — ABNORMAL HIGH (ref 11–307)

## 2019-06-01 LAB — D-DIMER, QUANTITATIVE: D-Dimer, Quant: 4.69 ug/mL-FEU — ABNORMAL HIGH (ref 0.00–0.50)

## 2019-06-01 LAB — C-REACTIVE PROTEIN: CRP: 20.9 mg/dL — ABNORMAL HIGH (ref <1.0)

## 2019-06-01 LAB — ABO/RH: ABO/RH(D): A POS

## 2019-06-01 LAB — MAGNESIUM: Magnesium: 1.9 mg/dL (ref 1.7–2.4)

## 2019-06-01 MED ORDER — ASPIRIN-ACETAMINOPHEN-CAFFEINE 250-250-65 MG PO TABS
2.0000 | ORAL_TABLET | Freq: Four times a day (QID) | ORAL | Status: DC | PRN
  Administered 2019-06-01 – 2019-06-03 (×3): 2 via ORAL
  Filled 2019-06-01 (×4): qty 2

## 2019-06-01 MED ORDER — POTASSIUM CHLORIDE CRYS ER 20 MEQ PO TBCR
40.0000 meq | EXTENDED_RELEASE_TABLET | Freq: Once | ORAL | Status: AC
  Administered 2019-06-01: 40 meq via ORAL
  Filled 2019-06-01: qty 2

## 2019-06-01 MED ORDER — BENZONATATE 100 MG PO CAPS
200.0000 mg | ORAL_CAPSULE | Freq: Three times a day (TID) | ORAL | Status: DC
  Administered 2019-06-01 – 2019-06-06 (×15): 200 mg via ORAL
  Filled 2019-06-01 (×17): qty 2

## 2019-06-01 MED ORDER — BENZONATATE 100 MG PO CAPS
100.0000 mg | ORAL_CAPSULE | Freq: Three times a day (TID) | ORAL | Status: DC
  Administered 2019-06-01: 11:00:00 100 mg via ORAL
  Filled 2019-06-01: qty 1

## 2019-06-01 MED ORDER — ORAL CARE MOUTH RINSE
15.0000 mL | Freq: Two times a day (BID) | OROMUCOSAL | Status: DC
  Administered 2019-06-01 – 2019-06-06 (×11): 15 mL via OROMUCOSAL

## 2019-06-01 NOTE — Progress Notes (Signed)
PROGRESS NOTE  Kari Blake [id number] DOB: 02/24/1966 DOA: 05/30/2019  PCP: Mayra Neer, MD  Brief History/Interval Summary: 53 y.o. female with medical history significant of hypertension, headache, who presented with cough, shortness breath, chest pain, fever, chills, abdominal pain, nausea, vomiting and diarrhea.  Patient was positive for COVID-19 on 9/25.  Patient underwent imaging studies which showed bilateral pulmonary opacities.  Patient was also complaining of abdominal discomfort and so she underwent a CT scan of the abdomen which did not reveal any acute findings.  Patient was placed on oxygen was subsequently transferred over to [facility].  Reason for Visit: Acute respiratory failure with hypoxia.  Pneumonia due to COVID-19.  Consultants: None  Procedures: None  Antibiotics: Anti-infectives (From admission, onward)   Start     Dose/Rate Route Frequency Ordered Stop   06/01/19 1000  azithromycin (ZITHROMAX) tablet 250 mg  Status:  Discontinued     250 mg Oral Daily 05/31/19 0527 05/31/19 0743   06/01/19 1000  remdesivir 100 mg in sodium chloride 0.9 % 250 mL IVPB     100 mg 500 mL/hr over 30 Minutes Intravenous Every 24 hours 05/31/19 1110 06/05/19 0959   05/31/19 1200  remdesivir 200 mg in sodium chloride 0.9 % 250 mL IVPB     200 mg 500 mL/hr over 30 Minutes Intravenous Once 05/31/19 1110 05/31/19 1306   05/31/19 1000  azithromycin (ZITHROMAX) tablet 500 mg  Status:  Discontinued     500 mg Oral Daily 05/31/19 0527 05/31/19 0743   05/31/19 0530  cefTRIAXone (ROCEPHIN) 1 g in sodium chloride 0.9 % 100 mL IVPB  Status:  Discontinued     1 g 200 mL/hr over 30 Minutes Intravenous Every 24 hours 05/31/19 0527 05/31/19 0743      Subjective/Interval History: Patient states that she has been having a lot of cough which causes her to become short of breath.  She had 1 spell last night.  Continues to have fatigue.  Feels slightly better from a breathing  standpoint.  Continues to have chest pain when she coughs or takes a deep breath.  Little bit of blood in the sputum is also noted.     Assessment/Plan:  Acute Hypoxic Resp. Failure/Pneumonia due to COVID-19  COVID-19 Labs  Recent Labs    05/31/19 0550 06/01/19 0350  DDIMER 2.14* 4.69*  FERRITIN 429* 410*  LDH 474*  --   CRP 27.3* 20.9*    Lab Results  Component Value Date   SARSCOV2NAA POSITIVE (A) 05/31/2019   SARSCOV2NAA Detected (A) 05/21/2019     Fever: Remains afebrile Oxygen requirements: Nasal cannula.  3 L/.  Saturating in the early 90s.   Antibacterials: Was ordered ceftriaxone and azithromycin but has been discontinued. Remdesivir: Day 2 Steroids: Solu-Medrol 40 mg twice a day Diuretics: None yet Actemra: 1 dose given on 10/5 Vitamin C and Zinc: Continue DVT Prophylaxis:  Lovenox 40 mg daily  Patient continues to have tenuous respiratory status.  She is requiring only 3 L of oxygen but her work of breathing was high.  Seems to be slightly better today but not significantly so.  Her CRP has improved from 27 to 20.9.  D-dimer noted to be elevated today at 4.69.  Recheck tomorrow.  May need higher dose of Lovenox.  Continue to trend inflammatory markers.  Continue Remdesivir and steroids.  She was given Actemra yesterday after long discussion.  See yesterday's note.  We will for now hold off on convalescent plasma.  Patient would also like to hold off.  She has read through the fact sheet and the informed consent.  Further discussion can be held with her tomorrow depending on her clinical status.  CT scan was done at the time of admission and showed extensive opacities bilaterally.  No PE was noted.  HIV nonreactive.  Procalcitonin was less than 0.1.  Continue with incentive spirometer mobilization as much as possible.  Prone position as much as possible.   Transaminitis This is most likely secondary to COVID-19.  LFTs are slightly better today.  Continue to monitor.   CT scan of the abdomen pelvis done at the time of admission did not show any acute findings.  Pleuritic chest pain with mild hemoptysis CT scan did not show any PE.  Symptoms most likely due to COVID-19.  Nausea vomiting diarrhea and abdominal discomfort Symptoms most likely due to COVID-19.  Abdomen is benign.  CT scan done at the time of admission did not show any acute findings.  Continue with soft diet.  Continue with Pepcid.  Non-obstructive left renal calculus was noted on CT scan.  Hypokalemia Potassium is within normal limits.  We will give additional supplements.  Magnesium 1.9.  History of essential hypertension Monitor blood pressure closely.  Hydralazine as needed.  History of headaches Chronic history of migraine headaches.  She takes Excedrin which is being continued.  Morbid obesity Estimated body mass index is 34.01 kg/m as calculated from the following:   Height as of this encounter: 5\' 1"  (1.549 m).   Weight as of this encounter: 81.6 kg.  DVT Prophylaxis: Lovenox PUD Prophylaxis: Pepcid Code Status: Full code Family Communication: Discussed with the patient.  Daughter being updated daily. Disposition Plan: Management as outlined above.  Mobilize as tolerated.  Hopefully will be able to return home when improved.   Medications:  Scheduled:  benzonatate  100 mg Oral TID   enoxaparin (LOVENOX) injection  40 mg Subcutaneous Daily   ipratropium  2 puff Inhalation Q4H   mouth rinse  15 mL Mouth Rinse BID   methylPREDNISolone (SOLU-MEDROL) injection  40 mg Intravenous Q12H   vitamin C  500 mg Oral Daily   zinc sulfate  220 mg Oral Daily   Continuous:  famotidine (PEPCID) IV 20 mg (06/01/19 0907)   remdesivir 100 mg in NS 250 mL 100 mg (06/01/19 1033)   [id number], aspirin-acetaminophen-caffeine, dextromethorphan-guaiFENesin, hydrALAZINE, levalbuterol, morphine injection, ondansetron **OR** ondansetron (ZOFRAN) IV   Objective:  Vital  Signs  Vitals:   05/31/19 1800 05/31/19 1949 06/01/19 0434 06/01/19 0752  BP: (!) 139/101 (!) 144/77 (!) 157/69 (!) 157/81  Pulse: (!) 104 99 93 82  Resp: (!) 23 (!) 24 20 18   Temp:  97.9 F (36.6 C) 99.2 F (37.3 C) 98.3 F (36.8 C)  TempSrc:  Oral Oral Oral  SpO2: 97%  96% 97%  Weight:      Height:        Intake/Output Summary (Last 24 hours) at 06/01/2019 1035 Last data filed at 06/01/2019 0400 Gross per 24 hour  Intake 770 ml  Output --  Net 770 ml   Filed Weights   05/31/19 0026  Weight: 81.6 kg    General appearance: Awake alert.  In no distress.  Morbidly obese Resp: Tachypneic at rest.  No use of accessory muscles.  Coarse breath sounds with crackles bilaterally.  No wheezing or rhonchi.   Cardio: S1-S2 is mildly tachycardic.  Regular.  No S3-S4.  No rubs or bruit.  GI: Abdomen is soft.  Nontender nondistended.  Bowel sounds are present normal.  No masses organomegaly Extremities: No edema.  Full range of motion of lower extremities. Neurologic: Alert and oriented x3.  No focal neurological deficits.    Lab Results:  Data Reviewed: I have personally reviewed following labs and imaging studies  CBC: Recent Labs  Lab 05/29/19 1304 05/31/19 0011 06/01/19 0350  WBC 6.8 7.7 4.3  NEUTROABS 5.7  --  3.0  HGB 13.0 12.3 11.8*  HCT 39.7 37.2 36.5  MCV 93.0 93.2 93.1  PLT 206 229 [id number]    Basic Metabolic Panel: Recent Labs  Lab 05/29/19 1304 05/31/19 0011 05/31/19 0707 06/01/19 0350  NA 136 141 139 140  K 2.9* 3.0* 3.6 3.5  CL 105 109 108 106  CO2 18* 20* 19* 22  GLUCOSE 111* 94 110* 117*  BUN 9 5* <5* 12  CREATININE 0.67 0.59 0.60 0.70  CALCIUM 8.6* 8.4* 8.1* 8.4*  MG  --   --   --  1.9    GFR: Estimated Creatinine Clearance: 79.6 mL/min (by C-G formula based on SCr of 0.7 mg/dL).  Liver Function Tests: Recent Labs  Lab 05/29/19 1304 05/31/19 0707 06/01/19 0350  AST 143* 135* 108*  ALT 168* 161* 140*  ALKPHOS 133* 142* 133*  BILITOT 1.0  0.8 0.6  PROT 7.9 7.5 7.2  ALBUMIN 3.7 3.1* 2.9*    Recent Labs  Lab 05/31/19 0550  LIPASE 24     HbA1C: Recent Labs    05/31/19 0550  HGBA1C 5.7*     Lipid Profile: Recent Labs    05/31/19 0550  CHOL 174  HDL 42  LDLCALC 107*  TRIG 123  CHOLHDL 4.1     Anemia Panel: Recent Labs    05/31/19 0550 06/01/19 0350  FERRITIN 429* 410*    Recent Results (from the past 240 hour(s))  SARS Coronavirus 2 [facility] order, Performed in [facility] hospital lab) Nasopharyngeal Nasopharyngeal Swab     Status: Abnormal   Collection Time: 05/31/19 12:11 AM   Specimen: Nasopharyngeal Swab  Result Value Ref Range Status   SARS Coronavirus 2 POSITIVE (A) NEGATIVE Final    Comment: RESULT CALLED TO, READ BACK BY AND VERIFIED WITH: RHONEY,A AT 0155 ON 05/31/2019 BY JPM (NOTE) If result is NEGATIVE SARS-CoV-2 target nucleic acids are NOT DETECTED. The SARS-CoV-2 RNA is generally detectable in upper and lower  respiratory specimens during the acute phase of infection. The lowest  concentration of SARS-CoV-2 viral copies this assay can detect is 250  copies / mL. A negative result does not preclude SARS-CoV-2 infection  and should not be used as the sole basis for treatment or other  patient management decisions.  A negative result may occur with  improper specimen collection / handling, submission of specimen other  than nasopharyngeal swab, presence of viral mutation(s) within the  areas targeted by this assay, and inadequate number of viral copies  (<250 copies / mL). A negative result must be combined with clinical  observations, patient history, and epidemiological information. If result is POSITIVE SARS-CoV-2 target nucleic acids are DETECTED.  The SARS-CoV-2 RNA is generally detectable in upper and lower  respiratory specimens during the acute phase of infection.  Positive  results are indicative of active infection with SARS-CoV-2.  Clinical  correlation with patient  history and other diagnostic information is  necessary to determine patient infection status.  Positive results do  not rule out bacterial infection or co-infection with  other viruses. If result is PRESUMPTIVE POSTIVE SARS-CoV-2 nucleic acids MAY BE PRESENT.   A presumptive positive result was obtained on the submitted specimen  and confirmed on repeat testing.  While 2019 novel coronavirus  (SARS-CoV-2) nucleic acids may be present in the submitted sample  additional confirmatory testing may be necessary for epidemiological  and / or clinical management purposes  to differentiate between  SARS-CoV-2 and other Sarbecovirus currently known to infect humans.  If clinically indicated additional testing with an alternate test  methodology [id number]) i s advised. The SARS-CoV-2 RNA is generally  detectable in upper and lower respiratory specimens during the acute  phase of infection. The expected result is Negative. Fact Sheet for Patients:  StrictlyIdeas.no Fact Sheet for Healthcare Providers: BankingDealers.co.za This test is not yet approved or cleared by the [place] FDA and has been authorized for detection and/or diagnosis of SARS-CoV-2 by FDA under an Emergency Use Authorization (EUA).  This EUA will remain in effect (meaning this test can be used) for the duration of the COVID-19 declaration under Section 564(b)(1) of the Act, 21 U.S.C. section 360bbb-3(b)(1), unless the authorization is terminated or revoked sooner. Performed at [facility], [place] [address] [place], [place] [address]   Culture, blood (Routine X 2) w Reflex to ID Panel     Status: None (Preliminary result)   Collection Time: 05/31/19  5:50 AM   Specimen: BLOOD LEFT FOREARM  Result Value Ref Range Status   Specimen Description   Final    BLOOD LEFT FOREARM Performed at [facility] [address] [place], [place]  [address]    Special Requests   Final    BOTTLES DRAWN AEROBIC AND ANAEROBIC Blood Culture adequate volume Performed at [facility] [address] [place], [place] [address]    Culture   Final    NO GROWTH < 12 HOURS Performed at [facility] [address] [place], [place] [address]    Report Status PENDING  Incomplete  Culture, blood (Routine X 2) w Reflex to ID Panel     Status: None (Preliminary result)   Collection Time: 05/31/19  5:51 AM   Specimen: BLOOD LEFT FOREARM  Result Value Ref Range Status   Specimen Description   Final    BLOOD LEFT FOREARM Performed at [facility] [address], [place], [place] [address]    Special Requests   Final    BOTTLES DRAWN AEROBIC AND ANAEROBIC Blood Culture adequate volume Performed at [place] [address] [place], [place] [address]    Culture   Final    NO GROWTH < 12 HOURS Performed at [facility] [address] [place], [place] [address]    Report Status PENDING  Incomplete      Radiology Studies: Ct Angio Chest Pe W Or Wo Contrast  Result Date: 05/31/2019 CLINICAL DATA:  COVID-19.  Flank pain and burning with urination. EXAM: CT ANGIOGRAPHY CHEST CT ABDOMEN AND PELVIS WITH CONTRAST TECHNIQUE: Multidetector CT imaging of the chest was performed using the standard protocol during bolus administration of intravenous contrast. Multiplanar CT image reconstructions and MIPs were obtained to evaluate the vascular anatomy. Multidetector CT imaging of the abdomen and pelvis was performed using the standard protocol during bolus administration of intravenous contrast. CONTRAST:  124mL OMNIPAQUE IOHEXOL 350 MG/ML SOLN COMPARISON:  Renal stone study 09/25/2016. Chest x-ray from 2 days ago FINDINGS: CTA CHEST FINDINGS Cardiovascular: Normal  heart size. No pericardial effusion. Significant motion artifact with no evidence of pulmonary embolism. Negative aorta.  Mediastinum/Nodes: Partially covered goiter.  No adenopathy Lungs/Pleura: Patchy ground-glass opacity in the bilateral lungs correlating with history of COVID-19 positivity. The opacity is extensive. No superimposed edema or pleural fluid Musculoskeletal: Generalized spondylosis. Review of the MIP images confirms the above findings. CT ABDOMEN and PELVIS FINDINGS Hepatobiliary: No focal liver abnormality.No evidence of biliary obstruction or stone. Pancreas: Unremarkable. Spleen: Unremarkable. Adrenals/Urinary Tract: Negative adrenals. No hydronephrosis or ureteral stone. 5 mm left lower pole calculus. A small right renal calculus is vascular. Unremarkable bladder. Stomach/Bowel:  No obstruction. No evidence of bowel inflammation. Vascular/Lymphatic: No acute vascular abnormality. No mass or adenopathy. Reproductive:Enlarged uterus from fundic mass which is heterogeneously enhancing and measures up to nearly 10 cm in diameter, appearance similar to prior. The uterus reaches the umbilicus. Other: No ascites or pneumoperitoneum. Small fatty umbilical hernia. Musculoskeletal: No acute abnormalities. Generalized moderate lumbar spine degeneration Review of the MIP images confirms the above findings. IMPRESSION: Chest CTA: 1. Extensive ground-glass pulmonary opacity that correlates well with COVID-19 positivity. 2. Significant motion degradation with no evidence of pulmonary embolism. Abdominal CT: 1. No acute finding. 2. Chronic uterine mass attributed to a 10 cm fibroid. 3. Nonobstructive left renal calculus. Electronically Signed   By: Monte Fantasia M.D.   On: 05/31/2019 04:50   Ct Abdomen Pelvis W Contrast  Result Date: 05/31/2019 CLINICAL DATA:  COVID-19.  Flank pain and burning with urination. EXAM: CT ANGIOGRAPHY CHEST CT ABDOMEN AND PELVIS WITH CONTRAST TECHNIQUE: Multidetector CT imaging of the chest was performed using the standard protocol during bolus administration of intravenous contrast. Multiplanar  CT image reconstructions and MIPs were obtained to evaluate the vascular anatomy. Multidetector CT imaging of the abdomen and pelvis was performed using the standard protocol during bolus administration of intravenous contrast. CONTRAST:  141mL OMNIPAQUE IOHEXOL 350 MG/ML SOLN COMPARISON:  Renal stone study 09/25/2016. Chest x-ray from 2 days ago FINDINGS: CTA CHEST FINDINGS Cardiovascular: Normal heart size. No pericardial effusion. Significant motion artifact with no evidence of pulmonary embolism. Negative aorta. Mediastinum/Nodes: Partially covered goiter.  No adenopathy Lungs/Pleura: Patchy ground-glass opacity in the bilateral lungs correlating with history of COVID-19 positivity. The opacity is extensive. No superimposed edema or pleural fluid Musculoskeletal: Generalized spondylosis. Review of the MIP images confirms the above findings. CT ABDOMEN and PELVIS FINDINGS Hepatobiliary: No focal liver abnormality.No evidence of biliary obstruction or stone. Pancreas: Unremarkable. Spleen: Unremarkable. Adrenals/Urinary Tract: Negative adrenals. No hydronephrosis or ureteral stone. 5 mm left lower pole calculus. A small right renal calculus is vascular. Unremarkable bladder. Stomach/Bowel:  No obstruction. No evidence of bowel inflammation. Vascular/Lymphatic: No acute vascular abnormality. No mass or adenopathy. Reproductive:Enlarged uterus from fundic mass which is heterogeneously enhancing and measures up to nearly 10 cm in diameter, appearance similar to prior. The uterus reaches the umbilicus. Other: No ascites or pneumoperitoneum. Small fatty umbilical hernia. Musculoskeletal: No acute abnormalities. Generalized moderate lumbar spine degeneration Review of the MIP images confirms the above findings. IMPRESSION: Chest CTA: 1. Extensive ground-glass pulmonary opacity that correlates well with COVID-19 positivity. 2. Significant motion degradation with no evidence of pulmonary embolism. Abdominal CT: 1. No  acute finding. 2. Chronic uterine mass attributed to a 10 cm fibroid. 3. Nonobstructive left renal calculus. Electronically Signed   By: Monte Fantasia M.D.   On: 05/31/2019 04:50       LOS: 1 day   [facility] Pager on [email or web address]  06/01/2019, 10:35 AM

## 2019-06-01 NOTE — Progress Notes (Signed)
Attempted to reach patient contact at this time for updates but did not receive an answer.

## 2019-06-02 LAB — CBC WITH DIFFERENTIAL/PLATELET
Abs Immature Granulocytes: 0.1 10*3/uL — ABNORMAL HIGH (ref 0.00–0.07)
Basophils Absolute: 0 10*3/uL (ref 0.0–0.1)
Basophils Relative: 0 %
Eosinophils Absolute: 0 10*3/uL (ref 0.0–0.5)
Eosinophils Relative: 0 %
HCT: 34.8 % — ABNORMAL LOW (ref 36.0–46.0)
Hemoglobin: 11.6 g/dL — ABNORMAL LOW (ref 12.0–15.0)
Immature Granulocytes: 1 %
Lymphocytes Relative: 18 %
Lymphs Abs: 1.2 10*3/uL (ref 0.7–4.0)
MCH: 31.3 pg (ref 26.0–34.0)
MCHC: 33.3 g/dL (ref 30.0–36.0)
MCV: 93.8 fL (ref 80.0–100.0)
Monocytes Absolute: 0.7 10*3/uL (ref 0.1–1.0)
Monocytes Relative: 10 %
Neutro Abs: 4.9 10*3/uL (ref 1.7–7.7)
Neutrophils Relative %: 71 %
Platelets: 362 10*3/uL (ref 150–400)
RBC: 3.71 MIL/uL — ABNORMAL LOW (ref 3.87–5.11)
RDW: 13.8 % (ref 11.5–15.5)
WBC: 6.9 10*3/uL (ref 4.0–10.5)
nRBC: 0 % (ref 0.0–0.2)

## 2019-06-02 LAB — COMPREHENSIVE METABOLIC PANEL
ALT: 112 U/L — ABNORMAL HIGH (ref 0–44)
AST: 60 U/L — ABNORMAL HIGH (ref 15–41)
Albumin: 3.1 g/dL — ABNORMAL LOW (ref 3.5–5.0)
Alkaline Phosphatase: 116 U/L (ref 38–126)
Anion gap: 14 (ref 5–15)
BUN: 13 mg/dL (ref 6–20)
CO2: 21 mmol/L — ABNORMAL LOW (ref 22–32)
Calcium: 8.7 mg/dL — ABNORMAL LOW (ref 8.9–10.3)
Chloride: 105 mmol/L (ref 98–111)
Creatinine, Ser: 0.58 mg/dL (ref 0.44–1.00)
GFR calc Af Amer: >60 mL/min (ref >60)
GFR calc non Af Amer: >60 mL/min (ref >60)
Glucose, Bld: 98 mg/dL (ref 70–99)
Potassium: 3.6 mmol/L (ref 3.5–5.1)
Sodium: 140 mmol/L (ref 135–145)
Total Bilirubin: 0.4 mg/dL (ref 0.3–1.2)
Total Protein: 7.3 g/dL (ref 6.5–8.1)

## 2019-06-02 LAB — MAGNESIUM: Magnesium: 2.1 mg/dL (ref 1.7–2.4)

## 2019-06-02 LAB — C-REACTIVE PROTEIN: CRP: 9.4 mg/dL — ABNORMAL HIGH (ref <1.0)

## 2019-06-02 LAB — LEGIONELLA PNEUMOPHILA SEROGP 1 UR AG: L. pneumophila Serogp 1 Ur Ag: NEGATIVE

## 2019-06-02 LAB — D-DIMER, QUANTITATIVE: D-Dimer, Quant: 2.32 ug/mL-FEU — ABNORMAL HIGH (ref 0.00–0.50)

## 2019-06-02 LAB — FERRITIN: Ferritin: 269 ng/mL (ref 11–307)

## 2019-06-02 MED ORDER — POTASSIUM CHLORIDE ER 10 MEQ PO TBCR
10.0000 meq | EXTENDED_RELEASE_TABLET | Freq: Every day | ORAL | Status: DC
  Administered 2019-06-02 – 2019-06-06 (×5): 10 meq via ORAL
  Filled 2019-06-02 (×6): qty 1

## 2019-06-02 MED ORDER — FAMOTIDINE 20 MG PO TABS
20.0000 mg | ORAL_TABLET | Freq: Two times a day (BID) | ORAL | Status: DC
  Administered 2019-06-02 – 2019-06-06 (×9): 20 mg via ORAL
  Filled 2019-06-02 (×10): qty 1

## 2019-06-02 MED ORDER — IPRATROPIUM BROMIDE HFA 17 MCG/ACT IN AERS
2.0000 | INHALATION_SPRAY | Freq: Four times a day (QID) | RESPIRATORY_TRACT | Status: DC
  Administered 2019-06-02 – 2019-06-03 (×4): 2 via RESPIRATORY_TRACT
  Filled 2019-06-02: qty 12.9

## 2019-06-02 NOTE — Progress Notes (Signed)
Patients sister attempted to be reached at this time but did not answer phone call

## 2019-06-02 NOTE — Plan of Care (Addendum)
Called into room by patient. Dry coughing spell with increased, shallow RR at around 40. 02 sat 88-91% on 1L/[place], Skin warm and dry. Tele cont. SR HR 89. Instructed and demonstrated slow deep breathing in through nose out through mouth. Pt. Slowly compliant. States "it feels like something is stuck in my throat". Pt. Able to swallow few sips of H20 without difficulty. Cough subsided during coaching and listening. Pt. Started to relax more and RR decreasing to 20-22/min. Bilat. Breath sounds diminished and unchanged to previous assessment. 02 sat now at 94% on 1L/[place]. Offered pt. To notify MD for possible cough syrup but pt. Deferred at present. 2 Icepacks provided for HA. No further coughing or SOB noted. Encouraged to prone self and pt. Only laying on right side. Explained to pt. The importance of rest and healing, instructed to let daughter know about it and that she will conversations with family to during day time hours. Pt. Verbalizes understanding. NAD noted at present.  0430 Pt. Resting with icepacks on neck and forehead for HA. VSS. RR 18, unlabored. NAD noted.

## 2019-06-02 NOTE — Progress Notes (Signed)
[place] TEAM 1 - Stepdown/ICU TEAM  Kari Blake  [id number] DOB: 07-Nov-1965 DOA: 05/30/2019 PCP: Mayra Neer, MD    Brief Narrative:  53yo with a history of HTN and chronic HA who presented with cough shortness of breath chest pain fevers chills abdominal pain nausea vomiting and diarrhea.  She had been previously diagnosed as COVID positive on September 25.  Imaging studies revealed bilateral pulmonary opacities.  CT abdomen did not reveal any acute findings.  The patient was transferred to [facility] for admission.  Significant Events: 10/5 admit to the [facility] via [facility]  COVID-19 specific Treatment: Remdesivir 10/5 > Solu-Medrol 10/5 > Actemra 10/5  Subjective: Continues to have significant coughing spells.  Oxygen saturations are in the upper 80s and low 90s on minimal oxygen support at 1 L nasal cannula.  Resting comfortably at the time of my visit.  Assessment & Plan:  COVID pneumonia - acute hypoxic respiratory failure  Recent Labs  Lab 05/29/19 1304 05/31/19 0550 05/31/19 0707 06/01/19 0350 06/02/19 0405  DDIMER  --  2.14*  --  4.69* 2.32*  FERRITIN  --  429*  --  410* 269  CRP  --  27.3*  --  20.9* 9.4*  ALT 168*  --  161* 140* 112*  PROCALCITON  --  <0.10  --   --   --     Transaminitis  LFTs improving -follow trend -likely due to a virus itself as this precedes initiation of Remdesivir  Recent Labs  Lab 05/29/19 1304 05/31/19 0707 06/01/19 0350 06/02/19 0405  AST 143* 135* 108* 60*  ALT 168* 161* 140* 112*  ALKPHOS 133* 142* 133* 116  BILITOT 1.0 0.8 0.6 0.4  PROT 7.9 7.5 7.2 7.3  ALBUMIN 3.7 3.1* 2.9* 3.1*     Pleuritic chest pain with mild hemoptysis No PE on CT scan - likely due to COVID itself  COVID gastroenteritis CT abdomen without acute findings  Hypokalemia Corrected with supplementation  HTN Blood pressure currently reasonably controlled  Chronic headaches  Morbid obesity - Estimated body mass  index is 34.01 kg/m as calculated from the following:   Height as of this encounter: 5\' 1"  (1.549 m).   Weight as of this encounter: 81.6 kg.  DVT prophylaxis: Lovenox Code Status: FULL CODE Family Communication:  Disposition Plan: MedSurg bed -incentive spirometer -PT OT  Consultants:  none  Antimicrobials:  None  Objective: Blood pressure 136/68, pulse 84, temperature 98.1 F (36.7 C), temperature source Oral, resp. rate 18, height 5\' 1"  (1.549 m), weight 81.6 kg, SpO2 93 %.  Intake/Output Summary (Last 24 hours) at 06/02/2019 0759 Last data filed at 06/02/2019 0516 Gross per 24 hour  Intake 540 ml  Output 500 ml  Net 40 ml   Filed Weights   05/31/19 0026  Weight: 81.6 kg    Examination: General: No acute respiratory distress Lungs: Fine crackles scattered throughout all fields bilaterally Cardiovascular: Regular rate and rhythm without murmur gallop or rub normal S1 and S2 Abdomen: Nontender, nondistended, soft, bowel sounds positive, no rebound, no ascites, no appreciable mass Extremities: No significant cyanosis, clubbing, or edema bilateral lower extremities  CBC: Recent Labs  Lab 05/29/19 1304 05/31/19 0011 06/01/19 0350 06/02/19 0405  WBC 6.8 7.7 4.3 6.9  NEUTROABS 5.7  --  3.0 4.9  HGB 13.0 12.3 11.8* 11.6*  HCT 39.7 37.2 36.5 34.8*  MCV 93.0 93.2 93.1 93.8  PLT 206 229 287 [id number]   Basic Metabolic Panel: Recent  Labs  Lab 05/31/19 0707 06/01/19 0350 06/02/19 0405  NA 139 140 140  K 3.6 3.5 3.6  CL 108 106 105  CO2 19* 22 21*  GLUCOSE 110* 117* 98  BUN <5* 12 13  CREATININE 0.60 0.70 0.58  CALCIUM 8.1* 8.4* 8.7*  MG  --  1.9 2.1   GFR: Estimated Creatinine Clearance: 79.6 mL/min (by C-G formula based on SCr of 0.58 mg/dL).  Liver Function Tests: Recent Labs  Lab 05/29/19 1304 05/31/19 0707 06/01/19 0350 06/02/19 0405  AST 143* 135* 108* 60*  ALT 168* 161* 140* 112*  ALKPHOS 133* 142* 133* 116  BILITOT 1.0 0.8 0.6 0.4  PROT 7.9 7.5  7.2 7.3  ALBUMIN 3.7 3.1* 2.9* 3.1*   Recent Labs  Lab 05/31/19 0550  LIPASE 24    HbA1C: Hgb A1c MFr Bld  Date/Time Value Ref Range Status  05/31/2019 05:50 AM 5.7 (H) 4.8 - 5.6 % Final    Comment:    (NOTE) Pre diabetes:          5.7%-6.4% Diabetes:              >6.4% Glycemic control for   <7.0% adults with diabetes      Recent Results (from the past 240 hour(s))  SARS Coronavirus 2 [facility] order, Performed in [facility] hospital lab) Nasopharyngeal Nasopharyngeal Swab     Status: Abnormal   Collection Time: 05/31/19 12:11 AM   Specimen: Nasopharyngeal Swab  Result Value Ref Range Status   SARS Coronavirus 2 POSITIVE (A) NEGATIVE Final    Comment: RESULT CALLED TO, READ BACK BY AND VERIFIED WITH: RHONEY,A AT 0155 ON 05/31/2019 BY JPM (NOTE) If result is NEGATIVE SARS-CoV-2 target nucleic acids are NOT DETECTED. The SARS-CoV-2 RNA is generally detectable in upper and lower  respiratory specimens during the acute phase of infection. The lowest  concentration of SARS-CoV-2 viral copies this assay can detect is 250  copies / mL. A negative result does not preclude SARS-CoV-2 infection  and should not be used as the sole basis for treatment or other  patient management decisions.  A negative result may occur with  improper specimen collection / handling, submission of specimen other  than nasopharyngeal swab, presence of viral mutation(s) within the  areas targeted by this assay, and inadequate number of viral copies  (<250 copies / mL). A negative result must be combined with clinical  observations, patient history, and epidemiological information. If result is POSITIVE SARS-CoV-2 target nucleic acids are DETECTED.  The SARS-CoV-2 RNA is generally detectable in upper and lower  respiratory specimens during the acute phase of infection.  Positive  results are indicative of active infection with SARS-CoV-2.  Clinical  correlation with patient history and other  diagnostic information is  necessary to determine patient infection status.  Positive results do  not rule out bacterial infection or co-infection with other viruses. If result is PRESUMPTIVE POSTIVE SARS-CoV-2 nucleic acids MAY BE PRESENT.   A presumptive positive result was obtained on the submitted specimen  and confirmed on repeat testing.  While 2019 novel coronavirus  (SARS-CoV-2) nucleic acids may be present in the submitted sample  additional confirmatory testing may be necessary for epidemiological  and / or clinical management purposes  to differentiate between  SARS-CoV-2 and other Sarbecovirus currently known to infect humans.  If clinically indicated additional testing with an alternate test  methodology [phone]) i s advised. The SARS-CoV-2 RNA is generally  detectable in upper and lower respiratory specimens  during the acute  phase of infection. The expected result is Negative. Fact Sheet for Patients:  StrictlyIdeas.no Fact Sheet for Healthcare Providers: BankingDealers.co.za This test is not yet approved or cleared by the [place] FDA and has been authorized for detection and/or diagnosis of SARS-CoV-2 by FDA under an Emergency Use Authorization (EUA).  This EUA will remain in effect (meaning this test can be used) for the duration of the COVID-19 declaration under Section 564(b)(1) of the Act, 21 U.S.C. section 360bbb-3(b)(1), unless the authorization is terminated or revoked sooner. Performed at [facility], [place] [address], [place], [place] [address]   Culture, blood (Routine X 2) w Reflex to ID Panel     Status: None (Preliminary result)   Collection Time: 05/31/19  5:50 AM   Specimen: BLOOD LEFT FOREARM  Result Value Ref Range Status   Specimen Description   Final    BLOOD LEFT FOREARM Performed at [facility] [address], [place], [place] [address]    Special  Requests   Final    BOTTLES DRAWN AEROBIC AND ANAEROBIC Blood Culture adequate volume Performed at [facility] [address], [place], [place] [address]    Culture   Final    NO GROWTH 1 DAY Performed at [facility], [place] [address] [place], [place] [address]    Report Status PENDING  Incomplete  Culture, blood (Routine X 2) w Reflex to ID Panel     Status: None (Preliminary result)   Collection Time: 05/31/19  5:51 AM   Specimen: BLOOD LEFT FOREARM  Result Value Ref [place] Status   Specimen Description   Final    BLOOD LEFT FOREARM Performed at [facility] [address], [place], [place] [address]    Special Requests   Final    BOTTLES DRAWN AEROBIC AND ANAEROBIC Blood Culture adequate volume Performed at [facility] [address] [place], [place] [address]    Culture   Final    NO GROWTH 1 DAY Performed at [facility], [place] [address] [place],  [address]    Report Status PENDING  Incomplete     Scheduled Meds: . benzonatate  200 mg Oral TID  . enoxaparin (LOVENOX) injection  40 mg Subcutaneous Daily  . ipratropium  2 puff Inhalation Q4H  . mouth rinse  15 mL Mouth Rinse BID  . methylPREDNISolone (SOLU-MEDROL) injection  40 mg Intravenous Q12H  . vitamin C  500 mg Oral Daily  . zinc sulfate  220 mg Oral Daily   Continuous Infusions: . famotidine (PEPCID) IV 20 mg (06/01/19 2205)  . remdesivir 100 mg in NS 250 mL 100 mg (06/01/19 1033)     LOS: 2 days   Cherene Altes, MD [facility] Office  [phone] Pager - Text Page per Amion  If 7PM-7AM, please contact night-coverage per Amion 06/02/2019, 7:59 AM

## 2019-06-03 LAB — C-REACTIVE PROTEIN: CRP: 5.1 mg/dL — ABNORMAL HIGH (ref <1.0)

## 2019-06-03 LAB — CBC WITH DIFFERENTIAL/PLATELET
Abs Immature Granulocytes: 0.19 10*3/uL — ABNORMAL HIGH (ref 0.00–0.07)
Basophils Absolute: 0 10*3/uL (ref 0.0–0.1)
Basophils Relative: 1 %
Eosinophils Absolute: 0 10*3/uL (ref 0.0–0.5)
Eosinophils Relative: 0 %
HCT: 37.5 % (ref 36.0–46.0)
Hemoglobin: 12.3 g/dL (ref 12.0–15.0)
Immature Granulocytes: 3 %
Lymphocytes Relative: 19 %
Lymphs Abs: 1.4 10*3/uL (ref 0.7–4.0)
MCH: 30.9 pg (ref 26.0–34.0)
MCHC: 32.8 g/dL (ref 30.0–36.0)
MCV: 94.2 fL (ref 80.0–100.0)
Monocytes Absolute: 0.8 10*3/uL (ref 0.1–1.0)
Monocytes Relative: 11 %
Neutro Abs: 4.9 10*3/uL (ref 1.7–7.7)
Neutrophils Relative %: 66 %
Platelets: 395 10*3/uL (ref 150–400)
RBC: 3.98 MIL/uL (ref 3.87–5.11)
RDW: 13.4 % (ref 11.5–15.5)
WBC: 7.3 10*3/uL (ref 4.0–10.5)
nRBC: 0 % (ref 0.0–0.2)

## 2019-06-03 LAB — COMPREHENSIVE METABOLIC PANEL
ALT: 85 U/L — ABNORMAL HIGH (ref 0–44)
AST: 37 U/L (ref 15–41)
Albumin: 3.3 g/dL — ABNORMAL LOW (ref 3.5–5.0)
Alkaline Phosphatase: 114 U/L (ref 38–126)
Anion gap: 14 (ref 5–15)
BUN: 14 mg/dL (ref 6–20)
CO2: 23 mmol/L (ref 22–32)
Calcium: 8.4 mg/dL — ABNORMAL LOW (ref 8.9–10.3)
Chloride: 103 mmol/L (ref 98–111)
Creatinine, Ser: 0.72 mg/dL (ref 0.44–1.00)
GFR calc Af Amer: >60 mL/min (ref >60)
GFR calc non Af Amer: >60 mL/min (ref >60)
Glucose, Bld: 84 mg/dL (ref 70–99)
Potassium: 3.6 mmol/L (ref 3.5–5.1)
Sodium: 140 mmol/L (ref 135–145)
Total Bilirubin: 1 mg/dL (ref 0.3–1.2)
Total Protein: 7.3 g/dL (ref 6.5–8.1)

## 2019-06-03 LAB — MAGNESIUM: Magnesium: 2.2 mg/dL (ref 1.7–2.4)

## 2019-06-03 LAB — FERRITIN: Ferritin: 187 ng/mL (ref 11–307)

## 2019-06-03 LAB — D-DIMER, QUANTITATIVE: D-Dimer, Quant: 2.3 ug/mL-FEU — ABNORMAL HIGH (ref 0.00–0.50)

## 2019-06-03 MED ORDER — DEXAMETHASONE 6 MG PO TABS
6.0000 mg | ORAL_TABLET | Freq: Every day | ORAL | Status: DC
  Administered 2019-06-04 – 2019-06-06 (×3): 6 mg via ORAL
  Filled 2019-06-03 (×3): qty 1

## 2019-06-03 MED ORDER — AMLODIPINE BESYLATE 5 MG PO TABS
10.0000 mg | ORAL_TABLET | Freq: Every day | ORAL | Status: DC
  Administered 2019-06-03 – 2019-06-06 (×4): 10 mg via ORAL
  Filled 2019-06-03 (×5): qty 2

## 2019-06-03 NOTE — Progress Notes (Signed)
   06/03/19 1358  Family/Significant Other Communication  Family/Significant Other Update Called;Updated (patient calls/facetime family with updates)

## 2019-06-03 NOTE — Progress Notes (Signed)
[facility] TEAM 1 - Stepdown/ICU TEAM  SABAH FUSCO  [id number] DOB: [birth year] DOA: 05/30/2019 PCP: Mayra Neer, MD    Brief Narrative:  53yo with a history of HTN and chronic HA who presented with cough shortness of breath chest pain fevers chills abdominal pain nausea vomiting and diarrhea.  She had been previously diagnosed as COVID positive on September 25.  Imaging studies revealed bilateral pulmonary opacities.  CT abdomen did not reveal any acute findings.  The patient was transferred to [facility] for admission.  Significant Events: 9/25 SARS-CoV-2 test positive 10/5 admit to the [facility] via [facility]  COVID-19 specific Treatment: Remdesivir 10/5 > Solu-Medrol 10/5 > Actemra 10/5  Subjective: Oxygen requirements improving with patient now only requiring 1 L nasal cannula to keep sats in the mid 90s.  The patient states she is feeling better overall.  She denies chest pain nausea vomiting or abdominal pain.  She reports a good appetite.  Assessment & Plan:  COVID pneumonia - acute hypoxic respiratory failure Appears to be responding nicely to our current treatment -completes course of Remdesivir 10/9 -ambulate -consider discharge 10/9  Recent Labs  Lab 05/29/19 1304 05/31/19 0550 05/31/19 0707 06/01/19 0350 06/02/19 0405 06/03/19 0145  DDIMER  --  2.14*  --  4.69* 2.32* 2.30*  FERRITIN  --  429*  --  410* 269  --   CRP  --  27.3*  --  20.9* 9.4*  --   ALT 168*  --  161* 140* 112* 85*  PROCALCITON  --  <0.10  --   --   --   --     Transaminitis  LFTs nearly normalized at this time - follow trend -likely due to a virus itself as this precedes initiation of Remdesivir  Recent Labs  Lab 05/29/19 1304 05/31/19 0707 06/01/19 0350 06/02/19 0405 06/03/19 0145  AST 143* 135* 108* 60* 37  ALT 168* 161* 140* 112* 85*  ALKPHOS 133* 142* 133* 116 114  BILITOT 1.0 0.8 0.6 0.4 1.0  PROT 7.9 7.5 7.2 7.3 7.3  ALBUMIN 3.7 3.1* 2.9* 3.1* 3.3*     Pleuritic chest pain with mild hemoptysis No PE on CT scan - likely due to COVID itself -resolved  COVID gastroenteritis CT abdomen without acute findings -symptoms now essentially resolved  HTN Blood pressure not quite at goal -adjust treatment and follow -does not appear to be on BP meds at home  Chronic headaches Stable presently  Morbid obesity - Estimated body mass index is 34.01 kg/m as calculated from the following:   Height as of this encounter: 5\' 1"  (1.549 m).   Weight as of this encounter: 81.6 kg.  DVT prophylaxis: Lovenox Code Status: FULL CODE Family Communication:  Disposition Plan: MedSurg bed -incentive spirometer -PT OT -probable discharge 10/9  Consultants:  none  Antimicrobials:  None  Objective: Blood pressure (!) 155/85, pulse 80, temperature 98 F (36.7 C), temperature source Oral, resp. rate 19, height 5\' 1"  (1.549 m), weight 81.6 kg, SpO2 94 %.  Intake/Output Summary (Last 24 hours) at 06/03/2019 0755 Last data filed at 06/03/2019 0600 Gross per 24 hour  Intake 600 ml  Output -  Net 600 ml   Filed Weights   05/31/19 0026  Weight: 81.6 kg    Examination: General: No acute respiratory distress Lungs: Faint basilar crackles with good air movement throughout other fields with no wheezing Cardiovascular: RRR without murmur or rub Abdomen: NT/ND, soft, bowel sounds positive Extremities: No  significant cyanosis, clubbing, or edema bilateral lower extremities  CBC: Recent Labs  Lab 06/01/19 0350 06/02/19 0405 06/03/19 0145  WBC 4.3 6.9 7.3  NEUTROABS 3.0 4.9 4.9  HGB 11.8* 11.6* 12.3  HCT 36.5 34.8* 37.5  MCV 93.1 93.8 94.2  PLT 287 362 [id number]   Basic Metabolic Panel: Recent Labs  Lab 06/01/19 0350 06/02/19 0405 06/03/19 0145  NA 140 140 140  K 3.5 3.6 3.6  CL 106 105 103  CO2 22 21* 23  GLUCOSE 117* 98 84  BUN 12 13 14   CREATININE 0.70 0.58 0.72  CALCIUM 8.4* 8.7* 8.4*  MG 1.9 2.1 2.2   GFR: Estimated Creatinine Clearance:  79.6 mL/min (by C-G formula based on SCr of 0.72 mg/dL).  Liver Function Tests: Recent Labs  Lab 05/31/19 0707 06/01/19 0350 06/02/19 0405 06/03/19 0145  AST 135* 108* 60* 37  ALT 161* 140* 112* 85*  ALKPHOS 142* 133* 116 114  BILITOT 0.8 0.6 0.4 1.0  PROT 7.5 7.2 7.3 7.3  ALBUMIN 3.1* 2.9* 3.1* 3.3*   Recent Labs  Lab 05/31/19 0550  LIPASE 24    HbA1C: Hgb A1c MFr Bld  Date/Time Value Ref Range Status  05/31/2019 05:50 AM 5.7 (H) 4.8 - 5.6 % Final    Comment:    (NOTE) Pre diabetes:          5.7%-6.4% Diabetes:              >6.4% Glycemic control for   <7.0% adults with diabetes      Recent Results (from the past 240 hour(s))  SARS Coronavirus 2 [facility] order, Performed in [facility] hospital lab) Nasopharyngeal Nasopharyngeal Swab     Status: Abnormal   Collection Time: 05/31/19 12:11 AM   Specimen: Nasopharyngeal Swab  Result Value Ref Range Status   SARS Coronavirus 2 POSITIVE (A) NEGATIVE Final    Comment: RESULT CALLED TO, READ BACK BY AND VERIFIED WITH: RHONEY,A AT 0155 ON 05/31/2019 BY JPM (NOTE) If result is NEGATIVE SARS-CoV-2 target nucleic acids are NOT DETECTED. The SARS-CoV-2 RNA is generally detectable in upper and lower  respiratory specimens during the acute phase of infection. The lowest  concentration of SARS-CoV-2 viral copies this assay can detect is 250  copies / mL. A negative result does not preclude SARS-CoV-2 infection  and should not be used as the sole basis for treatment or other  patient management decisions.  A negative result may occur with  improper specimen collection / handling, submission of specimen other  than nasopharyngeal swab, presence of viral mutation(s) within the  areas targeted by this assay, and inadequate number of viral copies  (<250 copies / mL). A negative result must be combined with clinical  observations, patient history, and epidemiological information. If result is POSITIVE SARS-CoV-2 target nucleic  acids are DETECTED.  The SARS-CoV-2 RNA is generally detectable in upper and lower  respiratory specimens during the acute phase of infection.  Positive  results are indicative of active infection with SARS-CoV-2.  Clinical  correlation with patient history and other diagnostic information is  necessary to determine patient infection status.  Positive results do  not rule out bacterial infection or co-infection with other viruses. If result is PRESUMPTIVE POSTIVE SARS-CoV-2 nucleic acids MAY BE PRESENT.   A presumptive positive result was obtained on the submitted specimen  and confirmed on repeat testing.  While 2019 novel coronavirus  (SARS-CoV-2) nucleic acids may be present in the submitted sample  additional confirmatory testing may  be necessary for epidemiological  and / or clinical management purposes  to differentiate between  SARS-CoV-2 and other Sarbecovirus currently known to infect humans.  If clinically indicated additional testing with an alternate test  methodology [phone]) i s advised. The SARS-CoV-2 RNA is generally  detectable in upper and lower respiratory specimens during the acute  phase of infection. The expected result is Negative. Fact Sheet for Patients:  StrictlyIdeas.no Fact Sheet for Healthcare Providers: BankingDealers.co.za This test is not yet approved or cleared by the [place] FDA and has been authorized for detection and/or diagnosis of SARS-CoV-2 by FDA under an Emergency Use Authorization (EUA).  This EUA will remain in effect (meaning this test can be used) for the duration of the COVID-19 declaration under Section 564(b)(1) of the Act, 21 U.S.C. section 360bbb-3(b)(1), unless the authorization is terminated or revoked sooner. Performed at [facility], [place] [address], [place], [place] [address]   Culture, blood (Routine X 2) w Reflex to ID Panel     Status: None (Preliminary  result)   Collection Time: 05/31/19  5:50 AM   Specimen: BLOOD LEFT FOREARM  Result Value Ref [place] Status   Specimen Description   Final    BLOOD LEFT FOREARM Performed at [facility] [address], [place], [place] [address]    Special Requests   Final    BOTTLES DRAWN AEROBIC AND ANAEROBIC Blood Culture adequate volume Performed at [facility] [address], [place], [place] [address]    Culture   Final    NO GROWTH 2 DAYS Performed at [facility] [address], [place], [place] [address]    Report Status PENDING  Incomplete  Culture, blood (Routine X 2) w Reflex to ID Panel     Status: None (Preliminary result)   Collection Time: 05/31/19  5:51 AM   Specimen: BLOOD LEFT FOREARM  Result Value Ref [place] Status   Specimen Description   Final    BLOOD LEFT FOREARM Performed at [facility] [address] [place], [place] [address]    Special Requests   Final    BOTTLES DRAWN AEROBIC AND ANAEROBIC Blood Culture adequate volume Performed at [facility] [address], [place], [place] [address]    Culture   Final    NO GROWTH 2 DAYS Performed at [facility] [address] [place],  [address]    Report Status PENDING  Incomplete     Scheduled Meds: . benzonatate  200 mg Oral TID  . enoxaparin (LOVENOX) injection  40 mg Subcutaneous Daily  . famotidine  20 mg Oral BID  . ipratropium  2 puff Inhalation QID  . mouth rinse  15 mL Mouth Rinse BID  . methylPREDNISolone (SOLU-MEDROL) injection  40 mg Intravenous Q12H  . potassium chloride  10 mEq Oral Daily  . vitamin C  500 mg Oral Daily  . zinc sulfate  220 mg Oral Daily   Continuous Infusions: . remdesivir 100 mg in NS 250 mL 100 mg (06/02/19 1004)     LOS: 3 days   Cherene Altes, MD [facility] Office  [phone] Pager - Text Page per Shea Evans  If 7PM-7AM, please contact night-coverage per Amion  06/03/2019, 7:55 AM

## 2019-06-03 NOTE — Progress Notes (Signed)
PT Cancellation Note  Patient Details Name: TORRYN HUNTING MRN: [id number] DOB: Mar 27, 1966   Cancelled Treatment:    Reason Eval/Treat Not Completed: Fatigue/lethargy limiting ability to participate, pt. Reports that she does not feel well, has been up in recliner. Will check back later and attempt to mobilize. Stressed importance of mobilizing.   Claretha Cooper 06/03/2019, 9:31 AM  [place]  Office [phone]

## 2019-06-03 NOTE — Evaluation (Signed)
Occupational Therapy Evaluation Patient Details Name: Kari Blake MRN: [id number] DOB: 1966/04/03 Today's Date: 06/03/2019    History of Present Illness 53yo with a history of HTN and chronic HA who presented with cough shortness of breath chest pain fevers chills abdominal pain nausea vomiting and diarrhea.  She had been previously diagnosed as COVID positive on September 25.  Imaging studies revealed bilateral pulmonary opacities   Clinical Impression   This 53 y/o female presents with the above. PTA pt reports independence with ADL, iADL and functional mobility, was working. Pt very lethargic initially upon entering room, requiring max encouragement to participate in therapy session. Pt requiring minA for functional transfers during session, modA for LB ADL and minA for toileting ADL. Pt on 1L/RA during session with SpO2 88-91% with activity, max HR noted 113. She will benefit from continued acute OT services and currently recommend follow up [facility] services after discharge to maximize her safety and independence with ADL and mobility. Will follow.      Follow Up Recommendations  Home health OT;Supervision/Assistance - 24 hour    Equipment Recommendations  3 in 1 bedside commode           Precautions / Restrictions Precautions Precautions: Fall Restrictions Weight Bearing Restrictions: No      Mobility Bed Mobility Overal bed mobility: Needs Assistance Bed Mobility: Supine to Sit;Sit to Supine     Supine to sit: Min guard Sit to supine: Min guard   General bed mobility comments: for lines, safety; increased time/effort  Transfers Overall transfer level: Needs assistance Equipment used: None Transfers: Sit to/from [facility] Sit to Stand: Min guard Stand pivot transfers: Min assist       General transfer comment: close guard for safety to stand and minA for balance with taking pivotal steps to/from [facility] (declined transfer to recliner post  toileting)    Balance Overall balance assessment: Needs assistance Sitting-balance support: Feet supported Sitting balance-Leahy Scale: Fair     Standing balance support: No upper extremity supported;Single extremity supported;During functional activity Standing balance-Leahy Scale: Fair Standing balance comment: able to static stand with close minguard for safety                           ADL either performed or assessed with clinical judgement   ADL Overall ADL's : Needs assistance/impaired Eating/Feeding: Modified independent;Sitting   Grooming: Set up;Sitting   Upper Body Bathing: Min guard;Sitting   Lower Body Bathing: Minimal assistance;Sit to/from stand   Upper Body Dressing : Set up;Min guard;Sitting   Lower Body Dressing: Moderate assistance;Sit to/from stand Lower Body Dressing Details (indicate cue type and reason): required assist to thread LEs into mesh underwear, due to increased nausea while sitting upright, pt able to advance over hips in standing with minA for balance Toilet Transfer: Minimal assistance;Stand-pivot;BSC   Toileting- Clothing Manipulation and Hygiene: Minimal assistance;Sit to/from stand Toileting - Clothing Manipulation Details (indicate cue type and reason): assist for gown management/balance in standing     Functional mobility during ADLs: Minimal assistance(stand pivot transfers) General ADL Comments: pt requiring increased encouragement to participate/ limited due to nausea, fatigue, weakness     Vision         Perception     Praxis      Pertinent Vitals/Pain Pain Assessment: Faces Faces Pain Scale: Hurts little more Pain Location: generalized Pain Descriptors / Indicators: Discomfort;Grimacing Pain Intervention(s): Monitored during session;Repositioned;Ice applied(ice per pt request)  Hand Dominance     Extremity/Trunk Assessment Upper Extremity Assessment Upper Extremity Assessment: Generalized weakness    Lower Extremity Assessment Lower Extremity Assessment: Defer to PT evaluation       Communication Communication Communication: No difficulties   Cognition Arousal/Alertness: Lethargic Behavior During Therapy: WFL for tasks assessed/performed;Flat affect Overall Cognitive Status: Within Functional Limits for tasks assessed                                     General Comments  pt on RA-1L O2 with SpO2 88-91% throughout, max HR noted 113, BP stable    Exercises     Shoulder Instructions      Home Living Family/patient expects to be discharged to:: Private residence   Available Help at Discharge: Family Type of Home: House Home Access: Stairs to enter [employer] of Steps: 3 in front, 2 in back   Home Layout: One level     Bathroom Shower/Tub: Teacher, early years/pre: Standard                Prior Functioning/Environment Level of Independence: Independent        Comments: working        OT Problem List: Decreased strength;Decreased range of motion;Decreased activity tolerance;Impaired balance (sitting and/or standing);Decreased safety awareness;Decreased knowledge of use of DME or AE;Obesity;Cardiopulmonary status limiting activity      OT Treatment/Interventions: Self-care/ADL training;Therapeutic exercise;Energy conservation;DME and/or AE instruction;Therapeutic activities;Patient/family education;Balance training;Visual/perceptual remediation/compensation    OT Goals(Current goals can be found in the care plan section) Acute Rehab OT Goals Patient Stated Goal: regain strength, independence OT Goal Formulation: With patient Time For Goal Achievement: 06/17/19 Potential to Achieve Goals: Good  OT Frequency: Min 3X/week   Barriers to D/C:            Co-evaluation PT/OT/SLP Co-Evaluation/Treatment: Yes Reason for Co-Treatment: For patient/therapist safety PT goals addressed during session: Mobility/safety with  mobility OT goals addressed during session: ADL's and self-care      AM-PAC OT "6 Clicks" Daily Activity     Outcome Measure Help from another person eating meals?: None Help from another person taking care of personal grooming?: A Little Help from another person toileting, which includes using toliet, bedpan, or urinal?: A Little Help from another person bathing (including washing, rinsing, drying)?: A Little Help from another person to put on and taking off regular upper body clothing?: None Help from another person to put on and taking off regular lower body clothing?: A Lot 6 Click Score: 19   End of Session Equipment Utilized During Treatment: Oxygen Nurse Communication: Mobility status  Activity Tolerance: Patient limited by fatigue Patient left: in bed;with call bell/phone within reach  OT Visit Diagnosis: Unsteadiness on feet (R26.81);Muscle weakness (generalized) (M62.81)                Time: 1135-1200 OT Time Calculation (min): 25 min Charges:  OT General Charges $OT Visit: 1 Visit OT Evaluation $OT Eval Moderate Complexity: 1 Mod  Lou Cal, OT [employer] Pager [phone] Office [phone]  Raymondo Band 06/03/2019, 2:04 PM

## 2019-06-04 LAB — C-REACTIVE PROTEIN: CRP: 3.2 mg/dL — ABNORMAL HIGH (ref <1.0)

## 2019-06-04 LAB — CBC WITH DIFFERENTIAL/PLATELET
Abs Immature Granulocytes: 0.11 10*3/uL — ABNORMAL HIGH (ref 0.00–0.07)
Basophils Absolute: 0 10*3/uL (ref 0.0–0.1)
Basophils Relative: 0 %
Eosinophils Absolute: 0 10*3/uL (ref 0.0–0.5)
Eosinophils Relative: 0 %
HCT: 40.2 % (ref 36.0–46.0)
Hemoglobin: 12.9 g/dL (ref 12.0–15.0)
Immature Granulocytes: 2 %
Lymphocytes Relative: 16 %
Lymphs Abs: 1 10*3/uL (ref 0.7–4.0)
MCH: 30.1 pg (ref 26.0–34.0)
MCHC: 32.1 g/dL (ref 30.0–36.0)
MCV: 93.7 fL (ref 80.0–100.0)
Monocytes Absolute: 0.7 10*3/uL (ref 0.1–1.0)
Monocytes Relative: 10 %
Neutro Abs: 4.6 10*3/uL (ref 1.7–7.7)
Neutrophils Relative %: 72 %
Platelets: 456 10*3/uL — ABNORMAL HIGH (ref 150–400)
RBC: 4.29 MIL/uL (ref 3.87–5.11)
RDW: 13.1 % (ref 11.5–15.5)
WBC: 6.4 10*3/uL (ref 4.0–10.5)
nRBC: 0 % (ref 0.0–0.2)

## 2019-06-04 LAB — COMPREHENSIVE METABOLIC PANEL
ALT: 75 U/L — ABNORMAL HIGH (ref 0–44)
AST: 37 U/L (ref 15–41)
Albumin: 3.3 g/dL — ABNORMAL LOW (ref 3.5–5.0)
Alkaline Phosphatase: 106 U/L (ref 38–126)
Anion gap: 14 (ref 5–15)
BUN: 13 mg/dL (ref 6–20)
CO2: 23 mmol/L (ref 22–32)
Calcium: 8.4 mg/dL — ABNORMAL LOW (ref 8.9–10.3)
Chloride: 103 mmol/L (ref 98–111)
Creatinine, Ser: 0.77 mg/dL (ref 0.44–1.00)
GFR calc Af Amer: >60 mL/min (ref >60)
GFR calc non Af Amer: >60 mL/min (ref >60)
Glucose, Bld: 124 mg/dL — ABNORMAL HIGH (ref 70–99)
Potassium: 4.1 mmol/L (ref 3.5–5.1)
Sodium: 140 mmol/L (ref 135–145)
Total Bilirubin: 0.6 mg/dL (ref 0.3–1.2)
Total Protein: 7 g/dL (ref 6.5–8.1)

## 2019-06-04 LAB — D-DIMER, QUANTITATIVE: D-Dimer, Quant: 2.12 ug/mL-FEU — ABNORMAL HIGH (ref 0.00–0.50)

## 2019-06-04 LAB — MAGNESIUM: Magnesium: 2.2 mg/dL (ref 1.7–2.4)

## 2019-06-04 LAB — FERRITIN: Ferritin: 172 ng/mL (ref 11–307)

## 2019-06-04 MED ORDER — OXYCODONE HCL 5 MG PO TABS
5.0000 mg | ORAL_TABLET | Freq: Four times a day (QID) | ORAL | Status: DC | PRN
  Administered 2019-06-04 – 2019-06-06 (×4): 10 mg via ORAL
  Filled 2019-06-04 (×4): qty 2

## 2019-06-04 NOTE — Progress Notes (Signed)
[place] TEAM 1 - Stepdown/ICU TEAM  Kari Blake  [id number] DOB: 1966/03/10 DOA: 05/30/2019 PCP: Mayra Neer, MD    Brief Narrative:  53yo with a history of HTN and chronic HA who presented with cough shortness of breath chest pain fevers chills abdominal pain nausea vomiting and diarrhea.  She had been previously diagnosed as COVID positive on September 25.  Imaging studies revealed bilateral pulmonary opacities.  CT abdomen did not reveal any acute findings.  The patient was transferred to [facility] for admission.  Significant Events: 9/25 SARS-CoV-2 test positive 10/5 admit to the [facility] via [facility] ED  COVID-19 specific Treatment: Remdesivir 10/5 > Solu-Medrol 10/5 > Actemra 10/5  Subjective: Slowly improving. Denies SOB at rest, but reports that she still feels very weak and SOB when attempting to ambulate. She "had a rough night" with lots of coughing and dyspnea keeping her from sleeping well. She feels she is too weak and SOB w/ exertion to care for herself at this time, and reports that she will be alone for at least 12hours every day she is home.   I feel she is at very high risk for failing if we attempt to d/c her home today. We will work on improving her exertional tolerance, stability, dyspnea, and sleep. Hopefully she will be ready for D/C within the next 24-48hrs.   Assessment & Plan:  COVID pneumonia - acute hypoxic respiratory failure Completes her course of Remdesivir today - cont steroid tx - PT/OT to continue to work w/ her   Recent [employer] 05/31/19 0550 05/31/19 0707 06/01/19 0350 06/02/19 0405 06/03/19 0145 06/04/19 0035  DDIMER 2.14*  --  4.69* 2.32* 2.30* 2.12*  FERRITIN 429*  --  410* 269 187 172  CRP 27.3*  --  20.9* 9.4* 5.1* 3.2*  ALT  --  161* 140* 112* 85* 75*  PROCALCITON <0.10  --   --   --   --   --     Transaminitis  LFTs nearly normalized at this time - follow trend -likely due to a virus itself as this  precedes initiation of Remdesivir  Recent Labs  Lab 05/31/19 0707 06/01/19 0350 06/02/19 0405 06/03/19 0145 06/04/19 0035  AST 135* 108* 60* 37 37  ALT 161* 140* 112* 85* 75*  ALKPHOS 142* 133* 116 114 106  BILITOT 0.8 0.6 0.4 1.0 0.6  PROT 7.5 7.2 7.3 7.3 7.0  ALBUMIN 3.1* 2.9* 3.1* 3.3* 3.3*     Pleuritic chest pain with mild hemoptysis No PE on CT scan - likely due to COVID itself - impacting her ability to sleep - adjust PM pain meds   COVID gastroenteritis CT abdomen without acute findings - symptoms resolved  HTN Blood pressure currently well controlled - is not on meds at home   Chronic headaches Stable presently  Morbid obesity - Estimated body mass index is 34.01 kg/m as calculated from the following:   Height as of this encounter: 5\' 1"  (1.549 m).   Weight as of this encounter: 81.6 kg.  DVT prophylaxis: Lovenox Code Status: FULL CODE Family Communication:  Disposition Plan: MedSurg bed -incentive spirometer -PT OT -probable discharge 10/10 - 10/11  Consultants:  none  Antimicrobials:  None  Objective: Blood pressure 120/77, pulse 89, temperature 98.2 F (36.8 C), resp. rate 20, height 5\' 1"  (1.549 m), weight 81.6 kg, SpO2 93 %. No intake or output data in the 24 hours ending 06/04/19 0728 Filed Weights   05/31/19  0026  Weight: 81.6 kg    Examination: General: No acute respiratory distress while at rest  Lungs: CTA B - no wheezing  Cardiovascular: RRR  Abdomen: NT/ND, soft, bowel sounds positive Extremities: no C/C/E B LE   CBC: Recent Labs  Lab 06/02/19 0405 06/03/19 0145 06/04/19 0035  WBC 6.9 7.3 6.4  NEUTROABS 4.9 4.9 4.6  HGB 11.6* 12.3 12.9  HCT 34.8* 37.5 40.2  MCV 93.8 94.2 93.7  PLT 362 395 [id number]*   Basic Metabolic Panel: Recent Labs  Lab 06/02/19 0405 06/03/19 0145 06/04/19 0035  NA 140 140 140  K 3.6 3.6 4.1  CL 105 103 103  CO2 21* 23 23  GLUCOSE 98 84 124*  BUN 13 14 13   CREATININE 0.58 0.72 0.77  CALCIUM  8.7* 8.4* 8.4*  MG 2.1 2.2 2.2   GFR: Estimated Creatinine Clearance: 79.6 mL/min (by C-G formula based on SCr of 0.77 mg/dL).  Liver Function Tests: Recent Labs  Lab 06/01/19 0350 06/02/19 0405 06/03/19 0145 06/04/19 0035  AST 108* 60* 37 37  ALT 140* 112* 85* 75*  ALKPHOS 133* 116 114 106  BILITOT 0.6 0.4 1.0 0.6  PROT 7.2 7.3 7.3 7.0  ALBUMIN 2.9* 3.1* 3.3* 3.3*   Recent Labs  Lab 05/31/19 0550  LIPASE 24    HbA1C: Hgb A1c MFr Bld  Date/Time Value Ref Range Status  05/31/2019 05:50 AM 5.7 (H) 4.8 - 5.6 % Final    Comment:    (NOTE) Pre diabetes:          5.7%-6.4% Diabetes:              >6.4% Glycemic control for   <7.0% adults with diabetes      Recent Results (from the past 240 hour(s))  SARS Coronavirus 2 [facility] order, Performed in [facility] hospital lab) Nasopharyngeal Nasopharyngeal Swab     Status: Abnormal   Collection Time: 05/31/19 12:11 AM   Specimen: Nasopharyngeal Swab  Result Value Ref Range Status   SARS Coronavirus 2 POSITIVE (A) NEGATIVE Final    Comment: RESULT CALLED TO, READ BACK BY AND VERIFIED WITH: RHONEY,A AT 0155 ON 05/31/2019 BY JPM (NOTE) If result is NEGATIVE SARS-CoV-2 target nucleic acids are NOT DETECTED. The SARS-CoV-2 RNA is generally detectable in upper and lower  respiratory specimens during the acute phase of infection. The lowest  concentration of SARS-CoV-2 viral copies this assay can detect is 250  copies / mL. A negative result does not preclude SARS-CoV-2 infection  and should not be used as the sole basis for treatment or other  patient management decisions.  A negative result may occur with  improper specimen collection / handling, submission of specimen other  than nasopharyngeal swab, presence of viral mutation(s) within the  areas targeted by this assay, and inadequate number of viral copies  (<250 copies / mL). A negative result must be combined with clinical  observations, patient history, and  epidemiological information. If result is POSITIVE SARS-CoV-2 target nucleic acids are DETECTED.  The SARS-CoV-2 RNA is generally detectable in upper and lower  respiratory specimens during the acute phase of infection.  Positive  results are indicative of active infection with SARS-CoV-2.  Clinical  correlation with patient history and other diagnostic information is  necessary to determine patient infection status.  Positive results do  not rule out bacterial infection or co-infection with other viruses. If result is PRESUMPTIVE POSTIVE SARS-CoV-2 nucleic acids MAY BE PRESENT.   A presumptive positive result was  obtained on the submitted specimen  and confirmed on repeat testing.  While 2019 novel coronavirus  (SARS-CoV-2) nucleic acids may be present in the submitted sample  additional confirmatory testing may be necessary for epidemiological  and / or clinical management purposes  to differentiate between  SARS-CoV-2 and other Sarbecovirus currently known to infect humans.  If clinically indicated additional testing with an alternate test  methodology [phone]) i s advised. The SARS-CoV-2 RNA is generally  detectable in upper and lower respiratory specimens during the acute  phase of infection. The expected result is Negative. Fact Sheet for Patients:  StrictlyIdeas.no Fact Sheet for Healthcare Providers: BankingDealers.co.za This test is not yet approved or cleared by the [place] FDA and has been authorized for detection and/or diagnosis of SARS-CoV-2 by FDA under an Emergency Use Authorization (EUA).  This EUA will remain in effect (meaning this test can be used) for the duration of the COVID-19 declaration under Section 564(b)(1) of the Act, 21 U.S.C. section 360bbb-3(b)(1), unless the authorization is terminated or revoked sooner. Performed at [facility], [place] [address] [place], [place] [address]    Culture, blood (Routine X 2) w Reflex to ID Panel     Status: None (Preliminary result)   Collection Time: 05/31/19  5:50 AM   Specimen: BLOOD LEFT FOREARM  Result Value Ref Range Status   Specimen Description   Final    BLOOD LEFT FOREARM Performed at [place] [address] [place], [place] [address]    Special Requests   Final    BOTTLES DRAWN AEROBIC AND ANAEROBIC Blood Culture adequate volume Performed at [place] [address], [place], [place] [address]    Culture   Final    NO GROWTH 3 DAYS Performed at [facility], [employer] [address] [place], [place] [address]    Report Status PENDING  Incomplete  Culture, blood (Routine X 2) w Reflex to ID Panel     Status: None (Preliminary result)   Collection Time: 05/31/19  5:51 AM   Specimen: BLOOD LEFT FOREARM  Result Value Ref Range Status   Specimen Description   Final    BLOOD LEFT FOREARM Performed at [facility] [address] [place], [place] [address]    Special Requests   Final    BOTTLES DRAWN AEROBIC AND ANAEROBIC Blood Culture adequate volume Performed at [place] [address], [place], [place] [address]    Culture   Final    NO GROWTH 3 DAYS Performed at [facility], [place] [address] [place], [place] [address]    Report Status PENDING  Incomplete     Scheduled Meds: . amLODipine  10 mg Oral Daily  . benzonatate  200 mg Oral TID  . dexamethasone  6 mg Oral Daily  . enoxaparin (LOVENOX) injection  40 mg Subcutaneous Daily  . famotidine  20 mg Oral BID  . mouth rinse  15 mL Mouth Rinse BID  . potassium chloride  10 mEq Oral Daily  . vitamin C  500 mg Oral Daily  . zinc sulfate  220 mg Oral Daily   Continuous Infusions: . remdesivir 100 mg in NS 250 mL Stopped (06/03/19 1910)     LOS: 4 days   Cherene Altes, MD [facility] Office  [phone] Pager - Text Page per Amion  If 7PM-7AM,  please contact night-coverage per Amion 06/04/2019, 7:28 AM

## 2019-06-04 NOTE — Plan of Care (Signed)
Pt understands and agrees with current POC.  

## 2019-06-04 NOTE — Evaluation (Signed)
Physical Therapy Evaluation-late entry for 06/03/19  Patient Details Name: Kari Blake MRN: [id number] DOB: August 30, 1965 Today's Date: 06/04/2019   History of Present Illness  53yo with a history of HTN and chronic HA who presented with cough shortness of breath chest pain fevers chills abdominal pain nausea vomiting and diarrhea.  She had been previously diagnosed as COVID positive on September 25.  Imaging studies revealed bilateral pulmonary opacities  Clinical Impression  The patient reports fatigue, coughing frequently during mobility. Patient should progress to return home. Pt admitted with above diagnosis.  Pt currently with functional limitations due to the deficits listed below (see PT Problem List). Pt will benefit from skilled PT to increase their independence and safety with mobility to allow discharge to the venue listed below.       Follow Up Recommendations No PT follow up    Equipment Recommendations  None recommended by PT    Recommendations for Other Services       Precautions / Restrictions Precautions Precautions: Fall Restrictions Weight Bearing Restrictions: No      Mobility  Bed Mobility Overal bed mobility: Needs Assistance Bed Mobility: Supine to Sit;Sit to Supine     Supine to sit: Min guard Sit to supine: Min guard   General bed mobility comments: for lines, safety; increased time/effort  Transfers Overall transfer level: Needs assistance Equipment used: None Transfers: Sit to/from [facility] Sit to Stand: Min guard Stand pivot transfers: Min assist       General transfer comment: close guard for safety to stand and minA for balance with taking pivotal steps to/from [facility] (declined transfer to recliner post toileting)  Ambulation/Gait             General Gait Details: pt. declined  Stairs            Wheelchair Mobility    Modified Rankin (Stroke Patients Only)       Balance   Sitting-balance  support: Feet supported Sitting balance-Leahy Scale: Fair     Standing balance support: No upper extremity supported;Single extremity supported;During functional activity Standing balance-Leahy Scale: Fair Standing balance comment: able to static stand with close minguard for safety, performed pericare post voiding.                             Pertinent Vitals/Pain Faces Pain Scale: Hurts little more Pain Location: generalized Pain Descriptors / Indicators: Discomfort;Grimacing Pain Intervention(s): Monitored during session    Home Living Family/patient expects to be discharged to:: Private residence Living Arrangements: Children Available Help at Discharge: Family Type of Home: House Home Access: Stairs to enter   Technical brewer of Steps: 3 in front, 2 in back Home Layout: One level Home Equipment: None      Prior Function Level of Independence: Independent         Comments: working     Journalist, newspaper        Extremity/Trunk Assessment   Upper Extremity Assessment Upper Extremity Assessment: Generalized weakness    Lower Extremity Assessment Lower Extremity Assessment: Generalized weakness    Cervical / Trunk Assessment Cervical / Trunk Assessment: Normal  Communication   Communication: No difficulties  Cognition Arousal/Alertness: Lethargic Behavior During Therapy: WFL for tasks assessed/performed;Flat affect Overall Cognitive Status: Within Functional Limits for tasks assessed  General Comments      Exercises     Assessment/Plan    PT Assessment Patient needs continued PT services  PT Problem List Decreased strength;Decreased mobility;Decreased safety awareness;Decreased activity tolerance;Decreased knowledge of use of DME       PT Treatment Interventions DME instruction;Therapeutic activities;Gait training;Therapeutic exercise;Functional mobility  training;Patient/family education    PT Goals (Current goals can be found in the Care Plan section)  Acute Rehab PT Goals Patient Stated Goal: regain strength, independence PT Goal Formulation: With patient Time For Goal Achievement: 06/18/19 Potential to Achieve Goals: Good    Frequency Min 3X/week   Barriers to discharge        Co-evaluation PT/OT/SLP Co-Evaluation/Treatment: Yes             AM-PAC PT "6 Clicks" Mobility  Outcome Measure Help needed turning from your back to your side while in a flat bed without using bedrails?: None Help needed moving from lying on your back to sitting on the side of a flat bed without using bedrails?: None Help needed moving to and from a bed to a chair (including a wheelchair)?: A Little Help needed standing up from a chair using your arms (e.g., wheelchair or bedside chair)?: A Little Help needed to walk in hospital room?: A Little Help needed climbing 3-5 steps with a railing? : A Lot 6 Click Score: 19    End of Session Equipment Utilized During Treatment: Oxygen Activity Tolerance: Patient limited by fatigue Patient left: in bed;with call bell/phone within reach Nurse Communication: Mobility status PT Visit Diagnosis: Difficulty in walking, not elsewhere classified (R26.2)    Time:  [id number]     Charges:  Low eval            Tresa Endo PT Acute Rehabilitation Services Pager [phone] Office [phone]   Claretha Cooper 06/04/2019, 9:14 AM

## 2019-06-04 NOTE — TOC Initial Note (Signed)
Transition of Care [facility]) - Initial/Assessment Note    Patient Details  Name: Kari Blake MRN: [id number] Date of Birth: May 05, 1966  Transition of Care [facility]) CM/SW Contact:    Midge Minium RN, BSN, NCM-BC, ACM-RN [phone] Phone Number: 06/04/2019, 4:22 PM  Clinical Narrative:                 CM following for dispositional needs. CM spoke with the patient via phone to discuss the POC. Patient states living at home with her daughter and being independent with her ADLs PTA. Patient is COVID + with a PMH: HTN and chronic HA who presented with cough shortness of breath chest pain fevers chills abdominal pain nausea vomiting and diarrhea. PT/OT eval completed with HHOT and BSC recommended; PT with no recommendations. Patient agreeable to the [facility] with orders entered in Epic. Following for [facility] needs since [place] will be difficult to service with no additional discipline needs. DME preference provided with [employer] selected. DME referral given to Learta Codding [facility] liaison); AVS updated. [facility] and a thermometer will be provided prior to discharge. Patient states her daughter will provide transportation home.     Barriers to Discharge: Continued Medical Work up   Patient Goals and CMS Choice Patient states their goals for this hospitalization and ongoing recovery are:: "to get better" CMS Medicare.gov Compare Post Acute Care list provided to:: Patient Choice offered to / list presented to : Patient  Expected Discharge Plan and Services   In-house Referral: NA Discharge Planning Services: CM Consult Post Acute Care Choice: Durable Medical Equipment Living arrangements for the past 2 months: Single Family Home Expected Discharge Date: (unknown)               DME Arranged: Bedside commode DME Agency: [place] Date DME Agency Contacted: 06/04/19 Time DME Agency Contacted: [phone] Representative spoke with at DME Agency: Learta Codding (liaison) [place] Arranged: NA [place] Agency: NA         Prior Living Arrangements/Services Living arrangements for the past 2 months: [place] Lives with:: Adult Children Patient language and need for interpreter reviewed:: Yes Do you feel safe going back to the place where you live?: Yes      Need for Family Participation in Patient Care: Yes (Comment) Care giver support system in place?: Yes (comment)   Criminal Activity/Legal Involvement Pertinent to Current Situation/Hospitalization: No - Comment as needed  Activities of Daily Living Home Assistive Devices/Equipment: None ADL Screening (condition at time of admission) Patient's cognitive ability adequate to safely complete daily activities?: Yes Is the patient deaf or have difficulty hearing?: No Does the patient have difficulty seeing, even when wearing glasses/contacts?: No Does the patient have difficulty concentrating, remembering, or making decisions?: No Patient able to express need for assistance with ADLs?: Yes Does the patient have difficulty dressing or bathing?: No Independently performs ADLs?: Yes (appropriate for developmental age) Does the patient have difficulty walking or climbing stairs?: No Weakness of Legs: Both Weakness of Arms/Hands: None  Permission Sought/Granted Permission sought to share information with : Case Manager, Chartered certified accountant granted to share information with : Yes, Verbal Permission Granted     Permission granted to share info w AGENCY: DME company        Emotional Assessment       Orientation: : Oriented to Self, Oriented to Place, Oriented to  Time, Oriented to Situation Alcohol / Substance Use: Not Applicable Psych Involvement: No (comment)  Admission diagnosis:  COVID-19 [U07.1]  Patient Active Problem List   Diagnosis Date Noted  . Abdominal pain 05/31/2019  . Chest pain 05/31/2019  . COVID-19 virus infection 05/31/2019  . Headache 05/31/2019  . Hypertension   . Acute respiratory disease  due to COVID-19 virus   . Sepsis ([place])   . Hypokalemia   . Nausea vomiting and diarrhea   . Acute respiratory failure with hypoxia ([place])   . Pes planus 04/29/2019   PCP:  Mayra Neer, MD Pharmacy:   [facility] [place], [address] [place] [address] Phone: [phone] Fax: [phone]     Social Determinants of Health (SDOH) Interventions    Readmission Risk Interventions No flowsheet data found.

## 2019-06-04 NOTE — Progress Notes (Signed)
Pt is A&Ox4, VSS, c/o cough and headache. Pt has generalized weakness but is able to and encouraged to self-prone. Will continue to monitor.

## 2019-06-05 LAB — COMPREHENSIVE METABOLIC PANEL
ALT: 56 U/L — ABNORMAL HIGH (ref 0–44)
AST: 23 U/L (ref 15–41)
Albumin: 2.9 g/dL — ABNORMAL LOW (ref 3.5–5.0)
Alkaline Phosphatase: 87 U/L (ref 38–126)
Anion gap: 9 (ref 5–15)
BUN: 15 mg/dL (ref 6–20)
CO2: 25 mmol/L (ref 22–32)
Calcium: 8.3 mg/dL — ABNORMAL LOW (ref 8.9–10.3)
Chloride: 106 mmol/L (ref 98–111)
Creatinine, Ser: 0.71 mg/dL (ref 0.44–1.00)
GFR calc Af Amer: >60 mL/min (ref >60)
GFR calc non Af Amer: >60 mL/min (ref >60)
Glucose, Bld: 132 mg/dL — ABNORMAL HIGH (ref 70–99)
Potassium: 3.6 mmol/L (ref 3.5–5.1)
Sodium: 140 mmol/L (ref 135–145)
Total Bilirubin: 0.5 mg/dL (ref 0.3–1.2)
Total Protein: 6.3 g/dL — ABNORMAL LOW (ref 6.5–8.1)

## 2019-06-05 LAB — CULTURE, BLOOD (ROUTINE X 2)
Culture: NO GROWTH
Culture: NO GROWTH
Special Requests: ADEQUATE
Special Requests: ADEQUATE

## 2019-06-05 NOTE — Progress Notes (Signed)
[place] TEAM 1 - Stepdown/ICU TEAM  Kari Blake  [id number] DOB: [birth year] DOA: 05/30/2019 PCP: Mayra Neer, MD    Brief Narrative:  53yo with a history of HTN and chronic HA who presented with cough shortness of breath chest pain fevers chills abdominal pain nausea vomiting and diarrhea.  She had been previously diagnosed as COVID positive on September 25.  Imaging studies revealed bilateral pulmonary opacities.  CT abdomen did not reveal any acute findings.  The patient was transferred to [place] for admission.  Significant Events: 9/25 SARS-CoV-2 test positive 10/5 admit to the [facility] via [facility]  COVID-19 specific Treatment: Remdesivir 10/5 > 10/9 Solu-Medrol 10/5 > Actemra 10/5  Subjective: Still feels very weak and unstable though she states this is improving.  Feels she would likely be stable for discharge tomorrow.  I continue to feel that she has an exceedingly high risk for failing if we attempt to discharge her today due to her general deconditioned status and poor exertional tolerance.  She denies new symptoms today.  No headache chest pain nausea vomiting or abdominal pain.  Assessment & Plan:  COVID pneumonia - acute hypoxic respiratory failure Has completed the course of Remdesivir - cont steroid tx - PT/OT to continue to work w/ her   Transaminitis LFTs have essentially normalized - likely due to the virus itself as this precedes initiation of Remdesivir  Recent Labs  Lab 06/01/19 0350 06/02/19 0405 06/03/19 0145 06/04/19 0035 06/05/19 0414  AST 108* 60* 37 37 23  ALT 140* 112* 85* 75* 56*  ALKPHOS 133* 116 114 106 87  BILITOT 0.6 0.4 1.0 0.6 0.5  PROT 7.2 7.3 7.3 7.0 6.3*  ALBUMIN 2.9* 3.1* 3.3* 3.3* 2.9*    Pleuritic chest pain with mild hemoptysis No PE on CT scan - likely due to COVID itself -much improved with adjustments made yesterday  COVID gastroenteritis CT abdomen without acute findings - symptoms resolved   HTN Blood pressure controlled - is not on meds at home   Chronic headaches Stable presently  Morbid obesity - Estimated body mass index is 34.01 kg/m as calculated from the following:   Height as of this encounter: 5\' 1"  (1.549 m).   Weight as of this encounter: 81.6 kg.  DVT prophylaxis: Lovenox Code Status: FULL CODE Family Communication:  Disposition Plan: MedSurg bed -incentive spirometer -PT OT -probable discharge 10/11  Consultants:  none  Antimicrobials:  None  Objective: Blood pressure 123/62, pulse 63, temperature 98.2 F (36.8 C), resp. rate 20, height 5\' 1"  (1.549 m), weight 81.6 kg, SpO2 99 %. No intake or output data in the 24 hours ending 06/05/19 0745 Filed Weights   05/31/19 0026  Weight: 81.6 kg    Examination: General: No acute respiratory distress  Lungs: CTA B without wheezing or focal crackles Cardiovascular: RRR  Abdomen: NT/ND, soft, BS+ Extremities: no C/C/E B lower extremities  CBC: Recent Labs  Lab 06/02/19 0405 06/03/19 0145 06/04/19 0035  WBC 6.9 7.3 6.4  NEUTROABS 4.9 4.9 4.6  HGB 11.6* 12.3 12.9  HCT 34.8* 37.5 40.2  MCV 93.8 94.2 93.7  PLT 362 395 [id number]*   Basic Metabolic Panel: Recent Labs  Lab 06/02/19 0405 06/03/19 0145 06/04/19 0035 06/05/19 0414  NA 140 140 140 140  K 3.6 3.6 4.1 3.6  CL 105 103 103 106  CO2 21* 23 23 25   GLUCOSE 98 84 124* 132*  BUN 13 14 13 15   CREATININE 0.58 0.72 0.77  0.71  CALCIUM 8.7* 8.4* 8.4* 8.3*  MG 2.1 2.2 2.2  --    GFR: Estimated Creatinine Clearance: 79.6 mL/min (by C-G formula based on SCr of 0.71 mg/dL).  Liver Function Tests: Recent Labs  Lab 06/02/19 0405 06/03/19 0145 06/04/19 0035 06/05/19 0414  AST 60* 37 37 23  ALT 112* 85* 75* 56*  ALKPHOS 116 114 106 87  BILITOT 0.4 1.0 0.6 0.5  PROT 7.3 7.3 7.0 6.3*  ALBUMIN 3.1* 3.3* 3.3* 2.9*   Recent Labs  Lab 05/31/19 0550  LIPASE 24    HbA1C: Hgb A1c MFr Bld  Date/Time Value Ref Range Status  05/31/2019 05:50  AM 5.7 (H) 4.8 - 5.6 % Final    Comment:    (NOTE) Pre diabetes:          5.7%-6.4% Diabetes:              >6.4% Glycemic control for   <7.0% adults with diabetes      Recent Results (from the past 240 hour(s))  SARS Coronavirus 2 [facility] order, Performed in [facility] hospital lab) Nasopharyngeal Nasopharyngeal Swab     Status: Abnormal   Collection Time: 05/31/19 12:11 AM   Specimen: Nasopharyngeal Swab  Result Value Ref Range Status   SARS Coronavirus 2 POSITIVE (A) NEGATIVE Final    Comment: RESULT CALLED TO, READ BACK BY AND VERIFIED WITH: RHONEY,A AT 0155 ON 05/31/2019 BY JPM (NOTE) If result is NEGATIVE SARS-CoV-2 target nucleic acids are NOT DETECTED. The SARS-CoV-2 RNA is generally detectable in upper and lower  respiratory specimens during the acute phase of infection. The lowest  concentration of SARS-CoV-2 viral copies this assay can detect is 250  copies / mL. A negative result does not preclude SARS-CoV-2 infection  and should not be used as the sole basis for treatment or other  patient management decisions.  A negative result may occur with  improper specimen collection / handling, submission of specimen other  than nasopharyngeal swab, presence of viral mutation(s) within the  areas targeted by this assay, and inadequate number of viral copies  (<250 copies / mL). A negative result must be combined with clinical  observations, patient history, and epidemiological information. If result is POSITIVE SARS-CoV-2 target nucleic acids are DETECTED.  The SARS-CoV-2 RNA is generally detectable in upper and lower  respiratory specimens during the acute phase of infection.  Positive  results are indicative of active infection with SARS-CoV-2.  Clinical  correlation with patient history and other diagnostic information is  necessary to determine patient infection status.  Positive results do  not rule out bacterial infection or co-infection with other viruses. If  result is PRESUMPTIVE POSTIVE SARS-CoV-2 nucleic acids MAY BE PRESENT.   A presumptive positive result was obtained on the submitted specimen  and confirmed on repeat testing.  While 2019 novel coronavirus  (SARS-CoV-2) nucleic acids may be present in the submitted sample  additional confirmatory testing may be necessary for epidemiological  and / or clinical management purposes  to differentiate between  SARS-CoV-2 and other Sarbecovirus currently known to infect humans.  If clinically indicated additional testing with an alternate test  methodology [id number]) i s advised. The SARS-CoV-2 RNA is generally  detectable in upper and lower respiratory specimens during the acute  phase of infection. The expected result is Negative. Fact Sheet for Patients:  StrictlyIdeas.no Fact Sheet for Healthcare Providers: BankingDealers.co.za This test is not yet approved or cleared by the [place] FDA and has been authorized  for detection and/or diagnosis of SARS-CoV-2 by FDA under an Emergency Use Authorization (EUA).  This EUA will remain in effect (meaning this test can be used) for the duration of the COVID-19 declaration under Section 564(b)(1) of the Act, 21 U.S.C. section 360bbb-3(b)(1), unless the authorization is terminated or revoked sooner. Performed at [facility], [place] [address] [place], [place] [address]   Culture, blood (Routine X 2) w Reflex to ID Panel     Status: None (Preliminary result)   Collection Time: 05/31/19  5:50 AM   Specimen: BLOOD LEFT FOREARM  Result Value Ref Range Status   Specimen Description   Final    BLOOD LEFT FOREARM Performed at [place] [address] [place], [place] [address]    Special Requests   Final    BOTTLES DRAWN AEROBIC AND ANAEROBIC Blood Culture adequate volume Performed at [place] [address] [place], [place] [address]     Culture   Final    NO GROWTH 4 DAYS Performed at [facility], [place] [address], [place], [place] [address]    Report Status PENDING  Incomplete  Culture, blood (Routine X 2) w Reflex to ID Panel     Status: None (Preliminary result)   Collection Time: 05/31/19  5:51 AM   Specimen: BLOOD LEFT FOREARM  Result Value Ref Range Status   Specimen Description   Final    BLOOD LEFT FOREARM Performed at [place] [address], [place], [place] [address]    Special Requests   Final    BOTTLES DRAWN AEROBIC AND ANAEROBIC Blood Culture adequate volume Performed at [place] [address] [place], [place] [address]    Culture   Final    NO GROWTH 4 DAYS Performed at [facility], [place] [address] [place], [place] [address]    Report Status PENDING  Incomplete     Scheduled Meds: . amLODipine  10 mg Oral Daily  . benzonatate  200 mg Oral TID  . dexamethasone  6 mg Oral Daily  . enoxaparin (LOVENOX) injection  40 mg Subcutaneous Daily  . famotidine  20 mg Oral BID  . mouth rinse  15 mL Mouth Rinse BID  . potassium chloride  10 mEq Oral Daily     LOS: 5 days   Cherene Altes, MD [facility] Office  [phone] Pager - Text Page per Amion  If 7PM-7AM, please contact night-coverage per Amion 06/05/2019, 7:45 AM

## 2019-06-06 MED ORDER — AMLODIPINE BESYLATE 10 MG PO TABS: 10.0000 mg | ORAL_TABLET | Freq: Every day | ORAL | 0 refills | Status: AC

## 2019-06-06 MED ORDER — DEXAMETHASONE 6 MG PO TABS: 6.0000 mg | ORAL_TABLET | Freq: Every day | ORAL | 0 refills | Status: AC

## 2019-06-06 MED ORDER — ACETAMINOPHEN 325 MG PO TABS: 650.0000 mg | ORAL_TABLET | Freq: Four times a day (QID) | ORAL | Status: AC | PRN

## 2019-06-06 MED ORDER — EXCEDRIN MIGRAINE 250-250-65 MG PO TABS: 2.0000 | ORAL_TABLET | Freq: Four times a day (QID) | ORAL | 0 refills | Status: AC | PRN

## 2019-06-06 NOTE — Discharge Summary (Signed)
DISCHARGE SUMMARY  Kari Blake  MR#: [id number]  DOB:1966-02-23  Date of Admission: 05/30/2019 Date of Discharge: 06/06/2019  Attending Physician:Amariyah Bazar Hennie Duos, MD  Patient's UH:[id number], Kari May, MD  Consults:  None   Disposition: D/C home   Follow-up Appts: [facility] Follow up.   Why: bedside commode Contact information: [place] [address] [phone]        Mayra Neer, MD Follow up in 5 day(s).   Specialty: Family Medicine Contact information: [address] [place] [address] [phone]           Tests Needing Follow-up: -assess general strength and presence of respiratory symptoms  -recheck LFTs as outpt to assure they have fully normalized -f/u BP control w/ pt newly started on Norvasc   Discharge Diagnoses: COVID pneumonia Acute hypoxic respiratory failure Transaminitis Pleuritic chest pain with mild hemoptysis COVID gastroenteritis HTN Chronic headaches Morbid obesity  Initial presentation: 53yo with a history of HTN and chronic HA who presented with cough shortness of breath chest pain fevers chills abdominal pain nausea vomiting and diarrhea.  She had been previously diagnosed as COVID positive on September 25.  Imaging studies revealed bilateral pulmonary opacities.  CT abdomen did not reveal any acute findings.  The patient was transferred to [place] for admission.  COVID-19 specific Treatment: Remdesivir 10/5 > 10/9 Solu-Medrol 10/5 > 10/14 Actemra 10/5  Hospital Course: 9/25 SARS-CoV-2 test positive 10/5 admit to the [facility] via [facility] 10/14 able to leave home quarantine   COVID pneumonia - acute hypoxic respiratory failure Has completed a 5 day course of Remdesivir - cont steroid tx to complete 10 days - dosed w/ Actemra x1 - remains very weak and deconditioned - will likely require an extended convalescent period at  home to return to prior baseline level of activity/function   Transaminitis LFTs have essentially normalized - likely due to the virus itself as this precedes initiation of Remdesivir - suggest f/u check as outpt   Pleuritic chest pain with mild hemoptysis No PE on CT scan - likely due to COVID itself - resolved at time of d/c home   COVID gastroenteritis CT abdomen without acute findings - symptoms resolved - tolerating oral intake w/o difficulty at time of d/c   HTN Was not on medical tx at time of admit - continue Norvasc initiated while inpatient after d/c home   Chronic headaches Stable presently  Morbid obesity - Estimated body mass index is 34.01 kg/m as calculated from the following:   Height as of this encounter: 5\' 1"  (1.549 m).   Weight as of this encounter: 81.6 kg.   Allergies as of 06/06/2019   No Known Allergies     Medication List    STOP taking these medications   DAYQUIL MULTI-SYMPTOM COLD/FLU PO   ibuprofen 200 MG tablet Commonly known as: ADVIL   ibuprofen 800 MG tablet Commonly known as: ADVIL   meloxicam 15 MG tablet Commonly known as: MOBIC   pseudoephedrine-acetaminophen 30-500 MG Tabs tablet Commonly known as: TYLENOL SINUS   sulfamethoxazole-trimethoprim 800-160 MG tablet Commonly known as: BACTRIM DS     TAKE these medications   acetaminophen 325 MG tablet Commonly known as: TYLENOL Take 2 tablets (650 mg total) by mouth every 6 (six) hours as needed for mild pain or headache (fever >/= 101).   amLODipine 10 MG tablet Commonly known as: NORVASC Take 1 tablet (10 mg total) by  mouth daily. Start taking on: June 07, 2019   benzonatate 100 MG capsule Commonly known as: TESSALON Take 1 capsule (100 mg total) by mouth 3 (three) times daily as needed for cough.   dexamethasone 6 MG tablet Commonly known as: DECADRON Take 1 tablet (6 mg total) by mouth daily for 3 days. Start taking on: June 07, 2019   Excedrin Migraine  250-250-65 MG tablet Generic drug: aspirin-acetaminophen-caffeine Take 2 tablets by mouth every 6 (six) hours as needed for migraine (headache). What changed:   medication strength  when to take this  reasons to take this   ondansetron 4 MG disintegrating tablet Commonly known as: Zofran ODT 4mg  ODT q4 hours prn nausea/vomit   potassium chloride 10 MEQ tablet Commonly known as: KLOR-CON Take 1 tablet (10 mEq total) by mouth daily.            Durable Medical Equipment  (From admission, onward)         Start     Ordered   06/04/19 1619  For home use only DME Bedside commode  Once    Question:  Patient needs a bedside commode to treat with the following condition  Answer:  Physical deconditioning   06/04/19 1618          Day of Discharge BP (!) 147/76 (BP Location: Right Arm)   Pulse 70   Temp 98 F (36.7 C) (Oral)   Resp 18   Ht 5\' 1"  (1.549 m)   Wt 81.6 kg   SpO2 100%   BMI 34.01 kg/m   Physical Exam: General: No acute respiratory distress Lungs: Clear to auscultation bilaterally without wheezes or crackles Cardiovascular: Regular rate and rhythm without murmur gallop or rub normal S1 and S2 Abdomen: Nontender, nondistended, soft, bowel sounds positive, no rebound, no ascites, no appreciable mass Extremities: No significant cyanosis, clubbing, or edema bilateral lower extremities  Basic Metabolic Panel: Recent Labs  Lab 06/01/19 0350 06/02/19 0405 06/03/19 0145 06/04/19 0035 06/05/19 0414  NA 140 140 140 140 140  K 3.5 3.6 3.6 4.1 3.6  CL 106 105 103 103 106  CO2 22 21* 23 23 25   GLUCOSE 117* 98 84 124* 132*  BUN 12 13 14 13 15   CREATININE 0.70 0.58 0.72 0.77 0.71  CALCIUM 8.4* 8.7* 8.4* 8.4* 8.3*  MG 1.9 2.1 2.2 2.2  --     Liver Function Tests: Recent Labs  Lab 06/01/19 0350 06/02/19 0405 06/03/19 0145 06/04/19 0035 06/05/19 0414  AST 108* 60* 37 37 23  ALT 140* 112* 85* 75* 56*  ALKPHOS 133* 116 114 106 87  BILITOT 0.6 0.4 1.0  0.6 0.5  PROT 7.2 7.3 7.3 7.0 6.3*  ALBUMIN 2.9* 3.1* 3.3* 3.3* 2.9*   Recent Labs  Lab 05/31/19 0550  LIPASE 24   CBC: Recent Labs  Lab 05/31/19 0011 06/01/19 0350 06/02/19 0405 06/03/19 0145 06/04/19 0035  WBC 7.7 4.3 6.9 7.3 6.4  NEUTROABS  --  3.0 4.9 4.9 4.6  HGB 12.3 11.8* 11.6* 12.3 12.9  HCT 37.2 36.5 34.8* 37.5 40.2  MCV 93.2 93.1 93.8 94.2 93.7  PLT 229 287 362 395 456*    Recent Labs    05/31/19 0550  BNP 39.5    Recent Results (from the past 240 hour(s))  SARS Coronavirus 2 [facility] order, Performed in [facility] hospital lab) Nasopharyngeal Nasopharyngeal Swab     Status: Abnormal   Collection Time: 05/31/19 12:11 AM   Specimen: Nasopharyngeal Swab  Result  Value Ref Range Status   SARS Coronavirus 2 POSITIVE (A) NEGATIVE Final    Comment: RESULT CALLED TO, READ BACK BY AND VERIFIED WITH: RHONEY,A AT 0155 ON 05/31/2019 BY JPM (NOTE) If result is NEGATIVE SARS-CoV-2 target nucleic acids are NOT DETECTED. The SARS-CoV-2 RNA is generally detectable in upper and lower  respiratory specimens during the acute phase of infection. The lowest  concentration of SARS-CoV-2 viral copies this assay can detect is 250  copies / mL. A negative result does not preclude SARS-CoV-2 infection  and should not be used as the sole basis for treatment or other  patient management decisions.  A negative result Blake occur with  improper specimen collection / handling, submission of specimen other  than nasopharyngeal swab, presence of viral mutation(s) within the  areas targeted by this assay, and inadequate number of viral copies  (<250 copies / mL). A negative result must be combined with clinical  observations, patient history, and epidemiological information. If result is POSITIVE SARS-CoV-2 target nucleic acids are DETECTED.  The SARS-CoV-2 RNA is generally detectable in upper and lower  respiratory specimens during the acute phase of infection.  Positive  results are  indicative of active infection with SARS-CoV-2.  Clinical  correlation with patient history and other diagnostic information is  necessary to determine patient infection status.  Positive results do  not rule out bacterial infection or co-infection with other viruses. If result is PRESUMPTIVE POSTIVE SARS-CoV-2 nucleic acids Blake BE PRESENT.   A presumptive positive result was obtained on the submitted specimen  and confirmed on repeat testing.  While 2019 novel coronavirus  (SARS-CoV-2) nucleic acids Blake be present in the submitted sample  additional confirmatory testing Blake be necessary for epidemiological  and / or clinical management purposes  to differentiate between  SARS-CoV-2 and other Sarbecovirus currently known to infect humans.  If clinically indicated additional testing with an alternate test  methodology [phone]) i s advised. The SARS-CoV-2 RNA is generally  detectable in upper and lower respiratory specimens during the acute  phase of infection. The expected result is Negative. Fact Sheet for Patients:  StrictlyIdeas.no Fact Sheet for Healthcare Providers: BankingDealers.co.za This test is not yet approved or cleared by the [place] FDA and has been authorized for detection and/or diagnosis of SARS-CoV-2 by FDA under an Emergency Use Authorization (EUA).  This EUA will remain in effect (meaning this test can be used) for the duration of the COVID-19 declaration under Section 564(b)(1) of the Act, 21 U.S.C. section 360bbb-3(b)(1), unless the authorization is terminated or revoked sooner. Performed at [facility], [place] [address] [place], [place] [address]   Culture, blood (Routine X 2) w Reflex to ID Panel     Status: None   Collection Time: 05/31/19  5:50 AM   Specimen: BLOOD LEFT FOREARM  Result Value Ref Range Status   Specimen Description   Final    BLOOD LEFT FOREARM Performed at [facility] [address] [place], [place] [address]    Special Requests   Final    BOTTLES DRAWN AEROBIC AND ANAEROBIC Blood Culture adequate volume Performed at [facility] [place] [address], [place], [place] [address]    Culture   Final    NO GROWTH 5 DAYS Performed at [facility], [place] [address], [place], [place] [address]    Report Status 06/05/2019 FINAL  Final  Culture, blood (Routine X 2) w Reflex to ID Panel  Status: None   Collection Time: 05/31/19  5:51 AM   Specimen: BLOOD LEFT FOREARM  Result Value Ref Range Status   Specimen Description   Final    BLOOD LEFT FOREARM Performed at [facility] [address], [place], [place] [address]    Special Requests   Final    BOTTLES DRAWN AEROBIC AND ANAEROBIC Blood Culture adequate volume Performed at [facility] [address], [place], [place] [address]    Culture   Final    NO GROWTH 5 DAYS Performed at [facility], [place] [address], [place], [place] [address]    Report Status 06/05/2019 FINAL  Final     Time spent in discharge (includes decision making & examination of pt): 35 minutes  06/06/2019, 10:47 AM   Cherene Altes, MD [facility] Office  [phone]

## 2019-06-06 NOTE — Progress Notes (Signed)
   06/06/19   To Whom it may concern,  Kari Blake was admitted to [facility] on 05/30/2019  and remained under my care in the hospital through 06/06/2019.  She has been advised that she should not return to work until 06/19/2019, at which time she will be cleared to resume all of her usual responsibilities.  Sincerely,  Cherene Altes, MD [facility] Office  [phone]

## 2019-06-06 NOTE — Plan of Care (Signed)
  Problem: Education: Goal: Knowledge of risk factors and measures for prevention of condition will improve Outcome: Adequate for Discharge   Problem: Coping: Goal: Psychosocial and spiritual needs will be supported Outcome: Adequate for Discharge   Problem: Respiratory: Goal: Will maintain a patent airway Outcome: Adequate for Discharge Goal: Complications related to the disease process, condition or treatment will be avoided or minimized Outcome: Adequate for Discharge   Problem: Acute Rehab OT Goals (only OT should resolve) Goal: Pt. Will Perform Grooming Outcome: Adequate for Discharge Goal: Pt. Will Perform Lower Body Bathing Outcome: Adequate for Discharge Goal: Pt. Will Perform Lower Body Dressing Outcome: Adequate for Discharge Goal: Pt. Will Transfer To Toilet Outcome: Adequate for Discharge Goal: Pt. Will Perform Toileting-Clothing Manipulation Outcome: Adequate for Discharge Goal: Pt/Caregiver Will Perform Home Exercise Program Outcome: Adequate for Discharge Goal: OT Additional ADL Goal #1 Outcome: Adequate for Discharge   Problem: Acute Rehab PT Goals(only PT should resolve) Goal: Patient Will Transfer Sit To/From Stand Outcome: Adequate for Discharge Goal: Pt Will Ambulate Outcome: Adequate for Discharge Goal: Pt/caregiver will Perform Home Exercise Program Outcome: Adequate for Discharge

## 2019-06-06 NOTE — Discharge Instructions (Signed)
COVID-19: How to Protect Yourself and Others Know how it spreads  There is currently no vaccine to prevent coronavirus disease 2019 (COVID-19).  The best way to prevent illness is to avoid being exposed to this virus.  The virus is thought to spread mainly from person-to-person. ? Between people who are in close contact with one another (within about 6 feet). ? Through respiratory droplets produced when an infected person coughs, sneezes or talks. ? These droplets can land in the mouths or noses of people who are nearby or possibly be inhaled into the lungs. ? Some recent studies have suggested that COVID-19 may be spread by people who are not showing symptoms. Everyone should Clean your hands often  Wash your hands often with soap and water for at least 20 seconds especially after you have been in a public place, or after blowing your nose, coughing, or sneezing.  If soap and water are not readily available, use a hand sanitizer that contains at least 60% alcohol. Cover all surfaces of your hands and rub them together until they feel dry.  Avoid touching your eyes, nose, and mouth with unwashed hands. Avoid close contact  Stay home if you are sick.  Avoid close contact with people who are sick.  Put distance between yourself and other people. ? Remember that some people without symptoms may be able to spread virus. ? This is especially important for people who are at higher risk of getting very GainPain.com.cy Cover your mouth and nose with a cloth face cover when around others  You could spread COVID-19 to others even if you do not feel sick.  Everyone should wear a cloth face cover when they have to go out in public, for example to the grocery store or to pick up other necessities. ? Cloth face coverings should not be placed on young children under age 62, anyone who has trouble breathing, or is  unconscious, incapacitated or otherwise unable to remove the mask without assistance.  The cloth face cover is meant to protect other people in case you are infected.  Do NOT use a facemask meant for a Dietitian.  Continue to keep about 6 feet between yourself and others. The cloth face cover is not a substitute for social distancing. Cover coughs and sneezes  If you are in a private setting and do not have on your cloth face covering, remember to always cover your mouth and nose with a tissue when you cough or sneeze or use the inside of your elbow.  Throw used tissues in the trash.  Immediately wash your hands with soap and water for at least 20 seconds. If soap and water are not readily available, clean your hands with a hand sanitizer that contains at least 60% alcohol. Clean and disinfect  Clean AND disinfect frequently touched surfaces daily. This includes tables, doorknobs, light switches, countertops, handles, desks, phones, keyboards, toilets, faucets, and sinks. RackRewards.fr  If surfaces are dirty, clean them: Use detergent or soap and water prior to disinfection.  Then, use a household disinfectant. You can see a list of EPA-registered household disinfectants here. michellinders.com 12/29/2018 This information is not intended to replace advice given to you by your health care provider. Make sure you discuss any questions you have with your health care provider. Document Released: 12/08/2018 Document Revised: 01/06/2019 Document Reviewed: 12/08/2018 Elsevier Patient Education  2020 [employer].   COVID-19 Frequently Asked Questions COVID-19 (coronavirus disease) is an infection that is caused by a large  family of viruses. Some viruses cause illness in people and others cause illness in animals like camels, cats, and bats. In some cases, the viruses that cause illness in animals can spread to  humans. Where did the coronavirus come from? In December 2019, [place] told the [facility]) of several cases of lung disease (human respiratory illness). These cases were linked to an open seafood and livestock market in the city of [place]. The link to the seafood and livestock market suggests that the virus may have spread from animals to humans. However, since that first outbreak in December, the virus has also been shown to spread from person to person. What is the name of the disease and the virus? Disease name Early on, this disease was called novel coronavirus. This is because scientists determined that the disease was caused by a new (novel) respiratory virus. The World Health Organization [facility]) has now named the disease COVID-19, or coronavirus disease. Virus name The virus that causes the disease is called severe acute respiratory syndrome coronavirus 2 (SARS-CoV-2). More information on disease and virus naming World Health Organization [facility]): www.who.int/emergencies/diseases/novel-coronavirus-2019/technical-guidance/naming-the-coronavirus-disease-(covid-2019)-and-the-virus-that-causes-it Who is at risk for complications from coronavirus disease? Some people may be at higher risk for complications from coronavirus disease. This includes older adults and people who have chronic diseases, such as heart disease, diabetes, and lung disease. If you are at higher risk for complications, take these extra precautions:  Avoid close contact with people who are sick or have a fever or cough. Stay at least 3-6 ft (1-2 m) away from them, if possible.  Wash your hands often with soap and water for at least 20 seconds.  Avoid touching your face, mouth, nose, or eyes.  Keep supplies on hand at home, such as food, medicine, and cleaning supplies.  Stay home as much as possible.  Avoid social gatherings and travel. How does coronavirus disease spread? The virus that causes  coronavirus disease spreads easily from person to person (is contagious). There are also cases of community-spread disease. This means the disease has spread to:  People who have no known contact with other infected people.  People who have not traveled to areas where there are known cases. It appears to spread from one person to another through droplets from coughing or sneezing. Can I get the virus from touching surfaces or objects? There is still a lot that we do not know about the virus that causes coronavirus disease. Scientists are basing a lot of information on what they know about similar viruses, such as:  Viruses cannot generally survive on surfaces for long. They need a human body (host) to survive.  It is more likely that the virus is spread by close contact with people who are sick (direct contact), such as through: ? Shaking hands or hugging. ? Breathing in respiratory droplets that travel through the air. This can happen when an infected person coughs or sneezes on or near other people.  It is less likely that the virus is spread when a person touches a surface or object that has the virus on it (indirect contact). The virus may be able to enter the body if the person touches a surface or object and then touches his or her face, eyes, nose, or mouth. Can a person spread the virus without having symptoms of the disease? It may be possible for the virus to spread before a person has symptoms of the disease, but this is most likely not the main way the  virus is spreading. It is more likely for the virus to spread by being in close contact with people who are sick and breathing in the respiratory droplets of a sick person's cough or sneeze. What are the symptoms of coronavirus disease? Symptoms vary from person to person and can range from mild to severe. Symptoms may include:  Fever.  Cough.  Tiredness, weakness, or fatigue.  Fast breathing or feeling short of breath. These  symptoms can appear anywhere from 2 to 14 days after you have been exposed to the virus. If you develop symptoms, call your health care provider. People with severe symptoms may need hospital care. If I am exposed to the virus, how long does it take before symptoms start? Symptoms of coronavirus disease may appear anywhere from 2 to 14 days after a person has been exposed to the virus. If you develop symptoms, call your health care provider. Should I be tested for this virus? Your health care provider will decide whether to test you based on your symptoms, history of exposure, and your risk factors. How does a health care provider test for this virus? Health care providers will collect samples to send for testing. Samples may include:  Taking a swab of fluid from the nose.  Taking fluid from the lungs by having you cough up mucus (sputum) into a sterile cup.  Taking a blood sample.  Taking a stool or urine sample. Is there a treatment or vaccine for this virus? Currently, there is no vaccine to prevent coronavirus disease. Also, there are no medicines like antibiotics or antivirals to treat the virus. A person who becomes sick is given supportive care, which means rest and fluids. A person may also relieve his or her symptoms by using over-the-counter medicines that treat sneezing, coughing, and runny nose. These are the same medicines that a person takes for the common cold. If you develop symptoms, call your health care provider. People with severe symptoms may need hospital care. What can I do to protect myself and my family from this virus?     You can protect yourself and your family by taking the same actions that you would take to prevent the spread of other viruses. Take the following actions:  Wash your hands often with soap and water for at least 20 seconds. If soap and water are not available, use alcohol-based hand sanitizer.  Avoid touching your face, mouth, nose, or  eyes.  Cough or sneeze into a tissue, sleeve, or elbow. Do not cough or sneeze into your hand or the air. ? If you cough or sneeze into a tissue, throw it away immediately and wash your hands.  Disinfect objects and surfaces that you frequently touch every day.  Avoid close contact with people who are sick or have a fever or cough. Stay at least 3-6 ft (1-2 m) away from them, if possible.  Stay home if you are sick, except to get medical care. Call your health care provider before you get medical care.  Make sure your vaccines are up to date. Ask your health care provider what vaccines you need. What should I do if I need to travel? Follow travel recommendations from your local health authority, the CDC, and WHO. Travel information and advice  Centers for Disease Control and Prevention (CDC): BodyEditor.hu  World Health Organization [facility]): ThirdIncome.ca Know the risks and take action to protect your health  You are at higher risk of getting coronavirus disease if you are traveling to  areas with an outbreak or if you are exposed to travelers from areas with an outbreak.  Wash your hands often and practice good hygiene to lower the risk of catching or spreading the virus. What should I do if I am sick? General instructions to stop the spread of infection  Wash your hands often with soap and water for at least 20 seconds. If soap and water are not available, use alcohol-based hand sanitizer.  Cough or sneeze into a tissue, sleeve, or elbow. Do not cough or sneeze into your hand or the air.  If you cough or sneeze into a tissue, throw it away immediately and wash your hands.  Stay home unless you must get medical care. Call your health care provider or local health authority before you get medical care.  Avoid public areas. Do not take public transportation, if possible.  If you can, wear  a mask if you must go out of the house or if you are in close contact with someone who is not sick. Keep your home clean  Disinfect objects and surfaces that are frequently touched every day. This may include: ? Counters and tables. ? Doorknobs and light switches. ? Sinks and faucets. ? Electronics such as phones, remote controls, keyboards, computers, and tablets.  Wash dishes in hot, soapy water or use a dishwasher. Air-dry your dishes.  Wash laundry in hot water. Prevent infecting other household members  Let healthy household members care for children and pets, if possible. If you have to care for children or pets, wash your hands often and wear a mask.  Sleep in a different bedroom or bed, if possible.  Do not share personal items, such as razors, toothbrushes, deodorant, combs, brushes, towels, and washcloths. Where to find more information Centers for Disease Control and Prevention (CDC)  Information and news updates: https://www.butler-gonzalez.com/ World Health Organization [facility]  Information and news updates: MissExecutive.com.ee  Coronavirus health topic: https://www.castaneda.info/  Questions and answers on COVID-19: OpportunityDebt.at  Global tracker: who.sprinklr.com American Academy of Pediatrics (AAP)  Information for families: www.healthychildren.org/English/health-issues/conditions/chest-lungs/Pages/2019-Novel-Coronavirus.aspx The coronavirus situation is changing rapidly. Check your local health authority website or the CDC and [facility] websites for updates and news. When should I contact a health care provider?  Contact your health care provider if you have symptoms of an infection, such as fever or cough, and you: ? Have been near anyone who is known to have coronavirus disease. ? Have come into contact with a person who is suspected to have coronavirus disease. ? Have traveled  outside of the country. When should I get emergency medical care?  Get help right away by calling your local emergency services (911 in the [place] if you have: ? Trouble breathing. ? Pain or pressure in your chest. ? Confusion. ? Blue-tinged lips and fingernails. ? Difficulty waking from sleep. ? Symptoms that get worse. Let the emergency medical personnel know if you think you have coronavirus disease. Summary  A new respiratory virus is spreading from person to person and causing COVID-19 (coronavirus disease).  The virus that causes COVID-19 appears to spread easily. It spreads from one person to another through droplets from coughing or sneezing.  Older adults and those with chronic diseases are at higher risk of disease. If you are at higher risk for complications, take extra precautions.  There is currently no vaccine to prevent coronavirus disease. There are no medicines, such as antibiotics or antivirals, to treat the virus.  You can protect yourself and your family by  washing your hands often, avoiding touching your face, and covering your coughs and sneezes. This information is not intended to replace advice given to you by your health care provider. Make sure you discuss any questions you have with your health care provider. Document Released: 12/08/2018 Document Revised: 12/08/2018 Document Reviewed: 12/08/2018 Elsevier Patient Education  [place] OF 05/31/2019, YOU ARE ADVISED TO REMAIN IN QUARANTINE AT [place] 06/13/2019   COVID-19 COVID-19 is a respiratory infection that is caused by a virus called severe acute respiratory syndrome coronavirus 2 (SARS-CoV-2). The disease is also known as coronavirus disease or novel coronavirus. In some people, the virus may not cause any symptoms. In others, it may cause a serious infection. The infection can get worse quickly and can lead to complications, such as:  Pneumonia, or infection of  the lungs.  Acute respiratory distress syndrome or ARDS. This is fluid build-up in the lungs.  Acute respiratory failure. This is a condition in which there is not enough oxygen passing from the lungs to the body.  Sepsis or septic shock. This is a serious bodily reaction to an infection.  Blood clotting problems.  Secondary infections due to bacteria or fungus. The virus that causes COVID-19 is contagious. This means that it can spread from person to person through droplets from coughs and sneezes (respiratory secretions). What are the causes? This illness is caused by a virus. You may catch the virus by:  Breathing in droplets from an infected person's cough or sneeze.  Touching something, like a table or a doorknob, that was exposed to the virus (contaminated) and then touching your mouth, nose, or eyes. What increases the risk? Risk for infection You are more likely to be infected with this virus if you:  Live in or travel to an area with a COVID-19 outbreak.  Come in contact with a sick person who recently traveled to an area with a COVID-19 outbreak.  Provide care for or live with a person who is infected with COVID-19. Risk for serious illness You are more likely to become seriously ill from the virus if you:  Are 40 years of age or older.  Have a long-term disease that lowers your body's ability to fight infection (immunocompromised).  Live in a nursing home or long-term care facility.  Have a long-term (chronic) disease such as: ? Chronic lung disease, including chronic obstructive pulmonary disease or asthma ? Heart disease. ? Diabetes. ? Chronic kidney disease. ? Liver disease.  Are obese. What are the signs or symptoms? Symptoms of this condition can range from mild to severe. Symptoms may appear any time from 2 to 14 days after being exposed to the virus. They include:  A fever.  A cough.  Difficulty breathing.  Chills.  Muscle pains.  A sore  throat.  Loss of taste or smell. Some people may also have stomach problems, such as nausea, vomiting, or diarrhea. Other people may not have any symptoms of COVID-19. How is this diagnosed? This condition may be diagnosed based on:  Your signs and symptoms, especially if: ? You live in an area with a COVID-19 outbreak. ? You recently traveled to or from an area where the virus is common. ? You provide care for or live with a person who was diagnosed with COVID-19.  A physical exam.  Lab tests, which may include: ? A nasal swab to take a sample of fluid from your nose. ? A throat swab to  take a sample of fluid from your throat. ? A sample of mucus from your lungs (sputum). ? Blood tests.  Imaging tests, which may include, X-rays, CT scan, or ultrasound. How is this treated? At present, there is no medicine to treat COVID-19. Medicines that treat other diseases are being used on a trial basis to see if they are effective against COVID-19. Your health care provider will talk with you about ways to treat your symptoms. For most people, the infection is mild and can be managed at home with rest, fluids, and over-the-counter medicines. Treatment for a serious infection usually takes places in a hospital intensive care unit (ICU). It may include one or more of the following treatments. These treatments are given until your symptoms improve.  Receiving fluids and medicines through an IV.  Supplemental oxygen. Extra oxygen is given through a tube in the nose, a face mask, or a hood.  Positioning you to lie on your stomach (prone position). This makes it easier for oxygen to get into the lungs.  Continuous positive airway pressure (CPAP) or bi-level positive airway pressure (BPAP) machine. This treatment uses mild air pressure to keep the airways open. A tube that is connected to a motor delivers oxygen to the body.  Ventilator. This treatment moves air into and out of the lungs by using a  tube that is placed in your windpipe.  Tracheostomy. This is a procedure to create a hole in the neck so that a breathing tube can be inserted.  Extracorporeal membrane oxygenation (ECMO). This procedure gives the lungs a chance to recover by taking over the functions of the heart and lungs. It supplies oxygen to the body and removes carbon dioxide. Follow these instructions at home: Lifestyle  If you are sick, stay home except to get medical care. Your health care provider will tell you how long to stay home. Call your health care provider before you go for medical care.  Rest at home as told by your health care provider.  Do not use any products that contain nicotine or tobacco, such as cigarettes, e-cigarettes, and chewing tobacco. If you need help quitting, ask your health care provider.  Return to your normal activities as told by your health care provider. Ask your health care provider what activities are safe for you. General instructions  Take over-the-counter and prescription medicines only as told by your health care provider.  Drink enough fluid to keep your urine pale yellow.  Keep all follow-up visits as told by your health care provider. This is important. How is this prevented?  There is no vaccine to help prevent COVID-19 infection. However, there are steps you can take to protect yourself and others from this virus. To protect yourself:   Do not travel to areas where COVID-19 is a risk. The areas where COVID-19 is reported change often. To identify high-risk areas and travel restrictions, check the CDC travel website: FatFares.com.br  If you live in, or must travel to, an area where COVID-19 is a risk, take precautions to avoid infection. ? Stay away from people who are sick. ? Wash your hands often with soap and water for 20 seconds. If soap and water are not available, use an alcohol-based hand sanitizer. ? Avoid touching your mouth, face, eyes, or  nose. ? Avoid going out in public, follow guidance from your state and local health authorities. ? If you must go out in public, wear a cloth face covering or face mask. ? Disinfect objects  and surfaces that are frequently touched every day. This may include:  Counters and tables.  Doorknobs and light switches.  Sinks and faucets.  Electronics, such as phones, remote controls, keyboards, computers, and tablets. To protect others: If you have symptoms of COVID-19, take steps to prevent the virus from spreading to others.  If you think you have a COVID-19 infection, contact your health care provider right away. Tell your health care team that you think you may have a COVID-19 infection.  Stay home. Leave your house only to seek medical care. Do not use public transport.  Do not travel while you are sick.  Wash your hands often with soap and water for 20 seconds. If soap and water are not available, use alcohol-based hand sanitizer.  Stay away from other members of your household. Let healthy household members care for children and pets, if possible. If you have to care for children or pets, wash your hands often and wear a mask. If possible, stay in your own room, separate from others. Use a different bathroom.  Make sure that all people in your household wash their hands well and often.  Cough or sneeze into a tissue or your sleeve or elbow. Do not cough or sneeze into your hand or into the air.  Wear a cloth face covering or face mask. Where to find more information  Centers for Disease Control and Prevention: PurpleGadgets.be  World Health Organization: https://www.castaneda.info/ Contact a health care provider if:  You live in or have traveled to an area where COVID-19 is a risk and you have symptoms of the infection.  You have had contact with someone who has COVID-19 and you have symptoms of the infection. Get help right away  if:  You have trouble breathing.  You have pain or pressure in your chest.  You have confusion.  You have bluish lips and fingernails.  You have difficulty waking from sleep.  You have symptoms that get worse. These symptoms may represent a serious problem that is an emergency. Do not wait to see if the symptoms will go away. Get medical help right away. Call your local emergency services (911 in the [place] Do not drive yourself to the hospital. Let the emergency medical personnel know if you think you have COVID-19. Summary  COVID-19 is a respiratory infection that is caused by a virus. It is also known as coronavirus disease or novel coronavirus. It can cause serious infections, such as pneumonia, acute respiratory distress syndrome, acute respiratory failure, or sepsis.  The virus that causes COVID-19 is contagious. This means that it can spread from person to person through droplets from coughs and sneezes.  You are more likely to develop a serious illness if you are 2 years of age or older, have a weak immunity, live in a nursing home, or have chronic disease.  There is no medicine to treat COVID-19. Your health care provider will talk with you about ways to treat your symptoms.  Take steps to protect yourself and others from infection. Wash your hands often and disinfect objects and surfaces that are frequently touched every day. Stay away from people who are sick and wear a mask if you are sick. This information is not intended to replace advice given to you by your health care provider. Make sure you discuss any questions you have with your health care provider. Document Released: 09/17/2018 Document Revised: 01/07/2019 Document Reviewed: 09/17/2018 Elsevier Patient Education  2020 [place]: How  to Protect Yourself and Others Know how it spreads  There is currently no vaccine to prevent coronavirus disease 2019 (COVID-19).  The best way to prevent illness  is to avoid being exposed to this virus.  The virus is thought to spread mainly from person-to-person. ? Between people who are in close contact with one another (within about 6 feet). ? Through respiratory droplets produced when an infected person coughs, sneezes or talks. ? These droplets can land in the mouths or noses of people who are nearby or possibly be inhaled into the lungs. ? Some recent studies have suggested that COVID-19 may be spread by people who are not showing symptoms. Everyone should Clean your hands often  Wash your hands often with soap and water for at least 20 seconds especially after you have been in a public place, or after blowing your nose, coughing, or sneezing.  If soap and water are not readily available, use a hand sanitizer that contains at least 60% alcohol. Cover all surfaces of your hands and rub them together until they feel dry.  Avoid touching your eyes, nose, and mouth with unwashed hands. Avoid close contact  Stay home if you are sick.  Avoid close contact with people who are sick.  Put distance between yourself and other people. ? Remember that some people without symptoms may be able to spread virus. ? This is especially important for people who are at higher risk of getting very Gain[employer].com.cy Cover your mouth and nose with a cloth face cover when around others  You could spread COVID-19 to others even if you do not feel sick.  Everyone should wear a cloth face cover when they have to go out in public, for example to the grocery store or to pick up other necessities. ? Cloth face coverings should not be placed on young children under age 42, anyone who has trouble breathing, or is unconscious, incapacitated or otherwise unable to remove the mask without assistance.  The cloth face cover is meant to protect other people in case you are infected.  Do NOT use a facemask  meant for a Dietitian.  Continue to keep about 6 feet between yourself and others. The cloth face cover is not a substitute for social distancing. Cover coughs and sneezes  If you are in a private setting and do not have on your cloth face covering, remember to always cover your mouth and nose with a tissue when you cough or sneeze or use the inside of your elbow.  Throw used tissues in the trash.  Immediately wash your hands with soap and water for at least 20 seconds. If soap and water are not readily available, clean your hands with a hand sanitizer that contains at least 60% alcohol. Clean and disinfect  Clean AND disinfect frequently touched surfaces daily. This includes tables, doorknobs, light switches, countertops, handles, desks, phones, keyboards, toilets, faucets, and sinks. [email or web address][employer][email or web address]  If surfaces are dirty, clean them: Use detergent or soap and water prior to disinfection.  Then, use a household disinfectant. You can see a list of EPA-registered household disinfectants here. [email or web address].com 12/29/2018 This information is not intended to replace advice given to you by your health care provider. Make sure you discuss any questions you have with your health care provider. Document Released: 12/08/2018 Document Revised: 01/06/2019 Document Reviewed: 12/08/2018 Elsevier Patient Education  [place] if You Are Sick If you are sick with COVID-19 or think  you might have COVID-19, follow the steps below to help protect other people in your home and community. Stay home except to get medical care.  Stay home. Most people with COVID-19 have mild illness and are able to recover at home without medical care. Do not leave your home, except to get medical care. Do not visit public areas.  Take care of yourself. Get rest and stay hydrated.  Get medical care when  needed. Call your doctor before you go to their office for care. But, if you have trouble breathing or other concerning symptoms, call 911 for immediate help.  Avoid public transportation, ride-sharing, or taxis. Separate yourself from other people and pets in your home.  As much as possible, stay in a specific room and away from other people and pets in your home. Also, you should use a separate bathroom, if available. If you need to be around other people or animals in or outside of the home, wear a cloth face covering. ? See COVID-19 and Animals if you have questions about pets: https://www.thomas.biz/ Monitor your symptoms.  Common symptoms of COVID-19 include fever and cough. Trouble breathing is a more serious symptom that means you should get medical attention.  Follow care instructions from your healthcare provider and local health department. Your local health authorities will give instructions on checking your symptoms and reporting information. If you develop emergency warning signs for COVID-19 get medical attention immediately.  Emergency warning signs include*:  Trouble breathing  Persistent pain or pressure in the chest  New confusion or not able to be woken  Bluish lips or face *This list is not all inclusive. Please consult your medical provider for any other symptoms that are severe or concerning to you. Call 911 if you have a medical emergency. If you have a medical emergency and need to call 911, notify the operator that you have or think you might have, COVID-19. If possible, put on a facemask before medical help arrives. Call ahead before visiting your doctor.  Call ahead. Many medical visits for routine care are being postponed or done by phone or telemedicine.  If you have a medical appointment that cannot be postponed, call your doctor's office. This will help the office protect themselves and other patients. If you are  sick, wear a cloth covering over your nose and mouth.  You should wear a cloth face covering over your nose and mouth if you must be around other people or animals, including pets (even at home).  You don't need to wear the cloth face covering if you are alone. If you can't put on a cloth face covering (because of trouble breathing for example), cover your coughs and sneezes in some other way. Try to stay at least 6 feet away from other people. This will help protect the people around you. Note: During the COVID-19 pandemic, medical grade facemasks are reserved for healthcare workers and some first responders. You may need to make a cloth face covering using a scarf or bandana. Cover your coughs and sneezes.  Cover your mouth and nose with a tissue when you cough or sneeze.  Throw used tissues in a lined trash can.  Immediately wash your hands with soap and water for at least 20 seconds. If soap and water are not available, clean your hands with an alcohol-based hand sanitizer that contains at least 60% alcohol. Clean your hands often.  Wash your hands often with soap and water for at least 20 seconds. This is  especially important after blowing your nose, coughing, or sneezing; going to the bathroom; and before eating or preparing food.  Use hand sanitizer if soap and water are not available. Use an alcohol-based hand sanitizer with at least 60% alcohol, covering all surfaces of your hands and rubbing them together until they feel dry.  Soap and water are the best option, especially if your hands are visibly dirty.  Avoid touching your eyes, nose, and mouth with unwashed hands. Avoid sharing personal household items.  Do not share dishes, drinking glasses, cups, eating utensils, towels, or bedding with other people in your home.  Wash these items thoroughly after using them with soap and water or put them in the dishwasher. Clean all "high-touch" surfaces everyday.  Clean and disinfect  high-touch surfaces in your "sick room" and bathroom. Let someone else clean and disinfect surfaces in common areas, but not your bedroom and bathroom.  If a caregiver or other person needs to clean and disinfect a sick person's bedroom or bathroom, they should do so on an as-needed basis. The caregiver/other person should wear a mask and wait as long as possible after the sick person has used the bathroom. High-touch surfaces include phones, remote controls, counters, tabletops, doorknobs, bathroom fixtures, toilets, keyboards, tablets, and bedside tables.  Clean and disinfect areas that may have blood, stool, or body fluids on them.  Use household cleaners and disinfectants. Clean the area or item with soap and water or another detergent if it is dirty. Then use a household disinfectant. ? Be sure to follow the instructions on the label to ensure safe and effective use of the product. Many products recommend keeping the surface wet for several minutes to ensure germs are killed. Many also recommend precautions such as wearing gloves and making sure you have good ventilation during use of the product. ? Most EPA-registered household disinfectants should be effective. How to discontinue home isolation  People with COVID-19 who have stayed home (home isolated) can stop home isolation under the following conditions: ? If you will not have a test to determine if you are still contagious, you can leave home after these three things have happened:  You have had no fever for at least 72 hours (that is three full days of no fever without the use of medicine that reduces fevers) AND  other symptoms have improved (for example, when your cough or shortness of breath has improved) AND  at least 10 days have passed since your symptoms first appeared. ? If you will be tested to determine if you are still contagious, you can leave home after these three things have happened:  You no longer have a fever  (without the use of medicine that reduces fevers) AND  other symptoms have improved (for example, when your cough or shortness of breath has improved) AND  you received two negative tests in a row, 24 hours apart. Your doctor will follow CDC guidelines. In all cases, follow the guidance of your healthcare provider and local health department. The decision to stop home isolation should be made in consultation with your healthcare provider and state and local health departments. Local decisions depend on local circumstances. [email or web address].com 12/27/2018 This information is not intended to replace advice given to you by your health care provider. Make sure you discuss any questions you have with your health care provider. Document Released: 12/08/2018 Document Revised: 01/06/2019 Document Reviewed: 12/08/2018 Elsevier Patient Education  [place].

## 2019-06-16 DIAGNOSIS — H0102B Squamous blepharitis left eye, upper and lower eyelids: Secondary | ICD-10-CM | POA: Diagnosis not present

## 2019-06-16 DIAGNOSIS — H17823 Peripheral opacity of cornea, bilateral: Secondary | ICD-10-CM | POA: Diagnosis not present

## 2019-06-16 DIAGNOSIS — H0102A Squamous blepharitis right eye, upper and lower eyelids: Secondary | ICD-10-CM | POA: Diagnosis not present

## 2019-06-16 DIAGNOSIS — H11431 Conjunctival hyperemia, right eye: Secondary | ICD-10-CM | POA: Diagnosis not present

## 2019-07-02 DIAGNOSIS — I10 Essential (primary) hypertension: Secondary | ICD-10-CM | POA: Diagnosis not present

## 2019-07-02 DIAGNOSIS — U071 COVID-19: Secondary | ICD-10-CM | POA: Diagnosis not present

## 2019-07-02 DIAGNOSIS — Z7689 Persons encountering health services in other specified circumstances: Secondary | ICD-10-CM | POA: Diagnosis not present

## 2019-07-02 DIAGNOSIS — J1289 Other viral pneumonia: Secondary | ICD-10-CM | POA: Diagnosis not present

## 2020-08-24 ENCOUNTER — Ambulatory Visit: Payer: BLUE CROSS/BLUE SHIELD | Attending: Internal Medicine

## 2020-08-24 DIAGNOSIS — Z23 Encounter for immunization: Secondary | ICD-10-CM

## 2020-08-24 NOTE — Progress Notes (Signed)
   Covid-19 Vaccination Clinic  Name:  Kari Blake    MRN: [id number] DOB: 1966-03-19  08/24/2020  [name] was observed post Covid-19 immunization for 15 minutes without incident. She was provided with Vaccine Information Sheet and instruction to access the V-Safe system.   [name] was instructed to call 911 with any severe reactions post vaccine: Marland Kitchen Difficulty breathing  . Swelling of face and throat  . A fast heartbeat  . A bad rash all over body  . Dizziness and weakness   Immunizations Administered    Name Date Dose VIS Date Route   Pfizer COVID-19 Vaccine 08/24/2020  2:37 PM 0.3 mL 06/14/2020 Intramuscular   Manufacturer: [employer], [employer]   Lot: [id number]   NDC: [address]-0

## 2021-03-14 ENCOUNTER — Ambulatory Visit
Admission: RE | Admit: 2021-03-14 | Discharge: 2021-03-14 | Disposition: A | Discharge status: Home / Self Care | Payer: 59 | Source: Ambulatory Visit | Attending: Physician Assistant | Admitting: Physician Assistant

## 2021-03-14 ENCOUNTER — Other Ambulatory Visit: Payer: Self-pay | Admitting: Physician Assistant

## 2021-03-14 DIAGNOSIS — M545 Low back pain, unspecified: Secondary | ICD-10-CM

## 2021-03-14 DIAGNOSIS — M25561 Pain in right knee: Secondary | ICD-10-CM

## 2021-03-14 DIAGNOSIS — M5459 Other low back pain: Secondary | ICD-10-CM

## 2021-03-14 DIAGNOSIS — M25562 Pain in left knee: Secondary | ICD-10-CM

## 2021-04-04 ENCOUNTER — Encounter: Payer: BLUE CROSS/BLUE SHIELD | Admitting: Nurse Practitioner

## 2021-04-24 ENCOUNTER — Other Ambulatory Visit: Payer: Self-pay

## 2021-04-24 ENCOUNTER — Encounter: Payer: Self-pay | Admitting: Family Medicine

## 2021-04-24 ENCOUNTER — Ambulatory Visit (INDEPENDENT_AMBULATORY_CARE_PROVIDER_SITE_OTHER): Payer: 59 | Admitting: Family Medicine

## 2021-04-24 ENCOUNTER — Other Ambulatory Visit (HOSPITAL_COMMUNITY)
Admission: RE | Admit: 2021-04-24 | Discharge: 2021-04-24 | Disposition: A | Discharge status: Home / Self Care | Payer: 59 | Source: Ambulatory Visit | Attending: Nurse Practitioner | Admitting: Nurse Practitioner

## 2021-04-24 VITALS — BP 118/78 | HR 91 | Ht 62.0 in | Wt 191.8 lb

## 2021-04-24 DIAGNOSIS — Z1231 Encounter for screening mammogram for malignant neoplasm of breast: Secondary | ICD-10-CM | POA: Diagnosis not present

## 2021-04-24 DIAGNOSIS — Z124 Encounter for screening for malignant neoplasm of cervix: Secondary | ICD-10-CM

## 2021-04-24 DIAGNOSIS — Z113 Encounter for screening for infections with a predominantly sexual mode of transmission: Secondary | ICD-10-CM | POA: Insufficient documentation

## 2021-04-24 NOTE — Progress Notes (Signed)
Patient is here today for full annual exam. She would like pap smear and full STI screening. Patient reports abnormal vaginal odor and would like to be tested for bacterial vaginosis and yeast infection.   Zella Richer, CMA   04/24/21 10:52 am

## 2021-04-24 NOTE — Progress Notes (Signed)
   GYNECOLOGY OFFICE VISIT NOTE  History:   Kari Blake is a 55 y.o. [id number] here today for annual exam. She has never had a pap smear completed. Her last mammogram was in 2012 and normal. She is menopausal and reports her menses stopped at age 74. She would like to have STI screening completed today. She has had a vaginal odor recently and would like to make sure it is not BV or yeast. She denies vaginal irritation or dysuria.   Past Medical History:  Diagnosis Date   Fibroid    Headache    Hypertension    Trichomonas infection     Past Surgical History:  Procedure Laterality Date   CESAREAN SECTION     EYE SURGERY     TUBAL LIGATION     The following portions of the patient's history were reviewed and updated as appropriate: allergies, current medications, past family history, past medical history, past social history, past surgical history and problem list.   Health Maintenance: Patient reports that she has never actually had a pap smear completed.  Normal mammogram in 2012.   Review of Systems:  Pertinent items noted in HPI and remainder of comprehensive ROS otherwise negative.  Physical Exam:  BP 118/78   Pulse 91   Ht '5\' 2"'$  (1.575 m)   Wt 191 lb 12.8 oz (87 kg)   BMI 35.08 kg/m   CONSTITUTIONAL: Well-developed, well-nourished female in no acute distress.  HEENT:  Normocephalic, atraumatic. Conjunctivae clear. Breast: Normal contour, no masses palpated, no axillary LAD, no nipple discharge, no abnormal skin changes over breast tissue.  CARDIOVASCULAR: Normal heart rate noted. RESPIRATORY: Effort and breath sounds normal, no problems with respiration noted. ABDOMEN: No masses noted. No other overt distention noted.   PELVIC: Normal appearing external genitalia; normal urethral meatus; mildly atrophic vaginal mucosa and cervix.  Normal uterine size without adnexal masses palpated. No abnormal discharge noted.  Performed in the presence of a chaperone. SKIN: No  rash noted. Warm and dry. MUSCULOSKELETAL: Normal range of motion. No edema noted. NEUROLOGIC: Alert and oriented to person, place, and time. PSYCHIATRIC: Normal mood and affect. Normal behavior. Normal judgment and thought content.  Assessment and Plan:   1. Screening mammogram for breast cancer 2. Screening for cervical cancer Annual exam performed. Pap smear completed and mammogram ordered. Will follow up results. RTC in one year for next annual exam or sooner as needed. - MM 3D SCREEN BREAST BILATERAL; Future - Cytology - PAP( Arp)  3. Screening for STDs (sexually transmitted diseases) Overall normal appearing vaginal discharge without erythema or lesions noted. Labs obtained as below for STI screening. Will follow up results and treat as indicated.  - HIV Antibody (routine testing w rflx) - RPR - Hepatitis C Antibody - Cervicovaginal ancillary only( )  Routine preventative health maintenance measures emphasized.  Return in 1 year (on 04/24/2022) for next annual visit, or sooner as needed.    Vilma Meckel, MD OB Fellow, [place] [facility] [place] 04/24/2021 9:22 PM

## 2021-04-25 LAB — HEPATITIS C ANTIBODY: Hep C Virus Ab: <0.1 s/co ratio (ref 0.0–0.9)

## 2021-04-25 LAB — CERVICOVAGINAL ANCILLARY ONLY
Bacterial Vaginitis (gardnerella): POSITIVE — AB
Candida Glabrata: NEGATIVE
Candida Vaginitis: NEGATIVE
Chlamydia: NEGATIVE
Comment: NEGATIVE
Comment: NEGATIVE
Comment: NEGATIVE
Comment: NEGATIVE
Comment: NEGATIVE
Comment: NORMAL
Neisseria Gonorrhea: NEGATIVE
Trichomonas: NEGATIVE

## 2021-04-25 LAB — HIV ANTIBODY (ROUTINE TESTING W REFLEX): HIV Screen 4th Generation wRfx: NONREACTIVE

## 2021-04-25 LAB — RPR: RPR Ser Ql: NONREACTIVE

## 2021-04-26 LAB — CYTOLOGY - PAP
Adequacy: ABSENT
Comment: NEGATIVE
Diagnosis: NEGATIVE
High risk HPV: NEGATIVE

## 2021-05-01 ENCOUNTER — Other Ambulatory Visit: Payer: Self-pay | Admitting: Family Medicine

## 2021-05-01 DIAGNOSIS — N760 Acute vaginitis: Secondary | ICD-10-CM

## 2021-05-01 MED ORDER — METRONIDAZOLE 500 MG PO TABS: 500.0000 mg | ORAL_TABLET | Freq: Two times a day (BID) | ORAL | 0 refills | Status: AC

## 2021-05-02 ENCOUNTER — Telehealth: Payer: Self-pay

## 2021-05-02 NOTE — Telephone Encounter (Signed)
Called pt; results and medication information given. Assisted pt with logging into MyChart account.

## 2021-05-02 NOTE — Telephone Encounter (Addendum)
-----   Message from Genia Del, MD sent at 05/01/2021 10:58 PM EDT ----- Please call patient regarding results. Her STD screening was all negative. Her pap smear showed normal cells and she tested negative for HPV. Her next pap smear will be due in 5 years. Her vaginal swab was positive for bacterial vaginosis. I have sent in Flagyl to her preferred pharmacy ([facility] on [address]). Be sure she knows to avoid alcohol while taking medication. I am happy to talk with her if she has any other questions or concerns. Thanks!   -Vilma Meckel   Called pt; VM left stating I am calling with results. Callback number given. MyChart message not sent, pt last active 08/04/2019.

## 2021-06-01 ENCOUNTER — Inpatient Hospital Stay: Admission: RE | Admit: 2021-06-01 | Payer: 59 | Source: Ambulatory Visit

## 2021-06-12 ENCOUNTER — Ambulatory Visit
Admission: RE | Admit: 2021-06-12 | Discharge: 2021-06-12 | Disposition: A | Discharge status: Home / Self Care | Payer: 59 | Source: Ambulatory Visit | Attending: Family Medicine | Admitting: Family Medicine

## 2021-06-12 ENCOUNTER — Other Ambulatory Visit: Payer: Self-pay

## 2021-06-12 DIAGNOSIS — Z1231 Encounter for screening mammogram for malignant neoplasm of breast: Secondary | ICD-10-CM

## 2022-09-28 ENCOUNTER — Encounter (HOSPITAL_COMMUNITY): Payer: Self-pay

## 2022-09-28 ENCOUNTER — Emergency Department (HOSPITAL_COMMUNITY): Payer: 59

## 2022-09-28 ENCOUNTER — Other Ambulatory Visit: Payer: Self-pay

## 2022-09-28 ENCOUNTER — Emergency Department (HOSPITAL_COMMUNITY)
Admission: EM | Admit: 2022-09-28 | Discharge: 2022-09-28 | Disposition: A | Discharge status: Home / Self Care | Payer: 59 | Attending: Emergency Medicine | Admitting: Emergency Medicine

## 2022-09-28 DIAGNOSIS — I10 Essential (primary) hypertension: Secondary | ICD-10-CM | POA: Insufficient documentation

## 2022-09-28 DIAGNOSIS — H538 Other visual disturbances: Secondary | ICD-10-CM | POA: Diagnosis not present

## 2022-09-28 DIAGNOSIS — Z79899 Other long term (current) drug therapy: Secondary | ICD-10-CM | POA: Insufficient documentation

## 2022-09-28 DIAGNOSIS — H539 Unspecified visual disturbance: Secondary | ICD-10-CM

## 2022-09-28 DIAGNOSIS — G43809 Other migraine, not intractable, without status migrainosus: Secondary | ICD-10-CM | POA: Diagnosis not present

## 2022-09-28 DIAGNOSIS — G43909 Migraine, unspecified, not intractable, without status migrainosus: Secondary | ICD-10-CM | POA: Diagnosis not present

## 2022-09-28 DIAGNOSIS — R519 Headache, unspecified: Secondary | ICD-10-CM | POA: Diagnosis not present

## 2022-09-28 MED ORDER — SODIUM CHLORIDE 0.9 % IV BOLUS
1000.0000 mL | Freq: Once | INTRAVENOUS | Status: AC
  Administered 2022-09-28: 1000 mL via INTRAVENOUS

## 2022-09-28 MED ORDER — DIPHENHYDRAMINE HCL 50 MG/ML IJ SOLN
25.0000 mg | Freq: Once | INTRAMUSCULAR | Status: AC
  Administered 2022-09-28: 25 mg via INTRAVENOUS
  Filled 2022-09-28: qty 1

## 2022-09-28 MED ORDER — METOCLOPRAMIDE HCL 5 MG/ML IJ SOLN
10.0000 mg | Freq: Once | INTRAMUSCULAR | Status: AC
  Administered 2022-09-28: 10 mg via INTRAVENOUS
  Filled 2022-09-28: qty 2

## 2022-09-28 MED ORDER — GADOBUTROL 1 MMOL/ML IV SOLN
9.0000 mL | Freq: Once | INTRAVENOUS | Status: AC | PRN
  Administered 2022-09-28: 9 mL via INTRAVENOUS

## 2022-09-28 MED ORDER — KETOROLAC TROMETHAMINE 30 MG/ML IJ SOLN
30.0000 mg | Freq: Once | INTRAMUSCULAR | Status: AC
  Administered 2022-09-28: 30 mg via INTRAMUSCULAR
  Filled 2022-09-28: qty 1

## 2022-09-28 NOTE — Discharge Instructions (Signed)
You are seen today for a headache that got better with medications.  Your CT scan and MRI were very reassuring.  Regarding the vision changes it looks like you have some bleeding in the back of your eye and you need to follow-up with the eye doctor on Monday.  Please come back to the ER if you have any new or worsening symptoms.

## 2022-09-28 NOTE — ED Provider Notes (Signed)
[facility] Provider Note   CSN: [id number] Arrival date & time: 09/28/22  1223     History  Chief Complaint  Patient presents with   Migraine    Kari Blake is a 57 y.o. female with a past medical history of hypertension, migraines presenting today for evaluation of headache.  Patient reports since 4 AM today she started to have headaches that are similar to her migraine episodes in the past.  Pain is located on the right side of her head.  She described it as shooting pain from her right if she open her right eye that radiates to her right ears and the back of her head.  Patient reports about a week ago she saw a blood spot in her right eye.  She reports seeing black spots and squeaky lines since.  She endorses nausea and vomiting.  No fever or recent head injury.  Patient has left eye blindness.   Migraine    Past Medical History:  Diagnosis Date   Fibroid    Headache    Hypertension    Trichomonas infection    Past Surgical History:  Procedure Laterality Date   CESAREAN SECTION     EYE SURGERY     TUBAL LIGATION       Home Medications Prior to Admission medications   Medication Sig Start Date End Date Taking? Authorizing Provider  acetaminophen (TYLENOL) 325 MG tablet Take 2 tablets (650 mg total) by mouth every 6 (six) hours as needed for mild pain or headache (fever >/= 101). 06/06/19   Cherene Altes, MD  amLODipine (NORVASC) 10 MG tablet Take 1 tablet (10 mg total) by mouth daily. 06/07/19   Cherene Altes, MD  aspirin-acetaminophen-caffeine (EXCEDRIN MIGRAINE) [phone] MG tablet Take 2 tablets by mouth every 6 (six) hours as needed for migraine (headache). 06/06/19   Cherene Altes, MD  benzonatate (TESSALON) 100 MG capsule Take 1 capsule (100 mg total) by mouth 3 (three) times daily as needed for cough. Patient not taking: Reported on 04/24/2021 05/29/19   Julianne Rice, MD  gabapentin  (NEURONTIN) 300 MG capsule Take 300 mg by mouth 2 (two) times daily. 02/20/21   [provider]  levalbuterol Penne Lash HFA) 45 MCG/ACT inhaler Inhale into the lungs. 07/02/19   [provider]  ondansetron (ZOFRAN ODT) 4 MG disintegrating tablet '4mg'$  ODT q4 hours prn nausea/vomit Patient not taking: Reported on 04/24/2021 05/29/19   Julianne Rice, MD  potassium chloride (KLOR-CON) 10 MEQ tablet Take 1 tablet (10 mEq total) by mouth daily. 05/30/19   Julianne Rice, MD      Allergies    Patient has no known allergies.    Review of Systems   Review of Systems Negative except as per HPI.  Physical Exam Updated Vital Signs BP 103/75   Pulse 82   Temp (!) 97.5 F (36.4 C) (Oral)   Resp 18   SpO2 97%  Physical Exam Vitals and nursing note reviewed.  Constitutional:      Appearance: Normal appearance.  HENT:     Head: Normocephalic and atraumatic.     Mouth/Throat:     Mouth: Mucous membranes are moist.  Eyes:     General: No scleral icterus. Cardiovascular:     Rate and Rhythm: Normal rate and regular rhythm.     Pulses: Normal pulses.     Heart sounds: Normal heart sounds.  Pulmonary:     Effort: Pulmonary effort is  normal.     Breath sounds: Normal breath sounds.  Abdominal:     General: Abdomen is flat.     Palpations: Abdomen is soft.     Tenderness: There is no abdominal tenderness.  Musculoskeletal:        General: No deformity.  Skin:    General: Skin is warm.     Findings: No rash.  Neurological:     General: No focal deficit present.     Mental Status: She is alert.     Comments: Cranial nerves II through XII intact. Intact sensation to light touch in all 4 extremities. 5/5 strength in all 4 extremities. Intact finger-to-nose and heel-to-shin of all 4 extremities. No visual field cuts. No neglect noted. No aphasia noted.    Psychiatric:        Mood and Affect: Mood normal.     ED Results / Procedures / Treatments   Labs (all labs ordered  are listed, but only abnormal results are displayed) Labs Reviewed - No data to display  EKG None  Radiology MR Brain W and Wo Contrast  Result Date: 09/28/2022 CLINICAL DATA:  Migraine with vision changes EXAM: MRI HEAD WITHOUT AND WITH CONTRAST TECHNIQUE: Multiplanar, multiecho pulse sequences of the brain and surrounding structures were obtained without and with intravenous contrast. CONTRAST:  83m GADAVIST GADOBUTROL 1 MMOL/ML IV SOLN COMPARISON:  None Available. FINDINGS: Brain: No acute infarct, mass effect or extra-axial collection. No acute or chronic hemorrhage. Normal white matter signal, parenchymal volume and CSF spaces. The midline structures are normal. Vascular: Major flow voids are preserved. Skull and upper cervical spine: Normal calvarium and skull base. Visualized upper cervical spine and soft tissues are normal. Sinuses/Orbits:No paranasal sinus fluid levels or advanced mucosal thickening. No mastoid or middle ear effusion. Normal orbits. IMPRESSION: Normal brain MRI. Electronically Signed   By: KUlyses JarredM.D.   On: 09/28/2022 19:30   CT Head Wo Contrast  Result Date: 09/28/2022 CLINICAL DATA:  Headache.  Visual changes. EXAM: CT HEAD WITHOUT CONTRAST TECHNIQUE: Contiguous axial images were obtained from the base of the skull through the vertex without intravenous contrast. RADIATION DOSE REDUCTION: This exam was performed according to the departmental dose-optimization program which includes automated exposure control, adjustment of the mA and/or kV according to patient size and/or use of iterative reconstruction technique. COMPARISON:  02/15/2011 FINDINGS: Brain: There is no evidence for acute hemorrhage, hydrocephalus, mass lesion, or abnormal extra-axial fluid collection. No definite CT evidence for acute infarction. Vascular: No hyperdense vessel or unexpected calcification. Skull: No evidence for fracture. No worrisome lytic or sclerotic lesion. Sinuses/Orbits: The visualized  paranasal sinuses and mastoid air cells are clear. Visualized portions of the globes and intraorbital fat are unremarkable. Other: None. IMPRESSION: No acute intracranial abnormality. Electronically Signed   By: EMisty StanleyM.D.   On: 09/28/2022 13:52    Procedures Procedures    Medications Ordered in ED Medications  metoCLOPramide (REGLAN) injection 10 mg (10 mg Intravenous Given 09/28/22 1334)  sodium chloride 0.9 % bolus 1,000 mL (1,000 mLs Intravenous New Bag/Given 09/28/22 1334)  diphenhydrAMINE (BENADRYL) injection 25 mg (25 mg Intravenous Given 09/28/22 1334)  ketorolac (TORADOL) 30 MG/ML injection 30 mg (30 mg Intramuscular Given 09/28/22 1334)  gadobutrol (GADAVIST) 1 MMOL/ML injection 9 mL (9 mLs Intravenous Contrast Given 09/28/22 1858)    ED Course/ Medical Decision Making/ A&P  Medical Decision Making Amount and/or Complexity of Data Reviewed Radiology: ordered.  Risk Prescription drug management.   This patient presents to the ED for headache and vision change, this involves an extensive number of treatment options, and is a complaint that carries with a high risk of complications and morbidity.  The differential diagnosis includes cluster headache, migraine, sinusitis, tension headache, acute glaucoma, cervical artery dissection, CO poisoning, encephalitis, encephalopathy, meningitis, mass, pseudotumor, subarachnoid hemorrhage, temporal arteritis/giant cell arteritis, traumatic intracranial hemorrhage.  This is not an exhaustive list.  Imaging studies: I ordered imaging studies. I personally reviewed, interpreted imaging and agree with the radiologist's interpretations. The results include: CT head and MRI brain normal.  Problem list/ ED course/ Critical interventions/ Medical management: HPI: See above Vital signs within normal range and stable throughout visit. Laboratory/imaging studies significant for: See above. On physical examination,  patient is afebrile and appears in no acute distress. This patient presents with a headache most consistent with benign headache from either tension type headache vs migraine. No headache red flags. Neurologic exam without evidence of meningismus, AMS, focal neurologic findings so doubt meningitis, encephalitis, stroke. Presentation not consistent with acute intracranial bleed to include SAH (lack of risk factors, headache history). No history of trauma so doubt ICH. Given history and physical temporal arteritis unlikely, as is acute angle closure glaucoma. Doubt carotid artery dissection given no focal neuro deficits, no neck trauma or recent neck strain. Patient with no signs of increased intracranial pressure or weight loss and history and physical suggest more benign headache so less likely mass effect in brain from tumor or abscess or idiopathic intracranial hypertension. Pain was controlled with headache cocktail.  Given patient's symptoms of seeing black floaters and squeaky lines that just started this week, I ordered MRI of the brain with pending results. Likely will need outpatient neuro follow up.  Cardiac monitoring/EKG: The patient was maintained on a cardiac monitor.  I personally reviewed and interpreted the cardiac monitor which showed an underlying rhythm of: sinus rhythm.  Additional history obtained: External records from outside source obtained and reviewed including: Chart review including previous notes, labs, imaging.  Disposition Patient's care signed out at shift change to [facility], PA-C with pending MRI. Marland Kitchen This chart was dictated using voice recognition software.  Despite best efforts to proofread,  errors can occur which can change the documentation meaning.          Final Clinical Impression(s) / ED Diagnoses Final diagnoses:  Other migraine without status migrainosus, not intractable    Rx / DC Orders ED Discharge Orders     None         Rex Kras,  [place] 09/28/22 Karl Bales    Godfrey Pick, MD 09/30/22 0830

## 2022-09-28 NOTE — ED Triage Notes (Signed)
Patient has had a migraine since 4am. Stated she sees a black spot in her right eye. Gets these with her migraines. Has not taken any medication before arrival. Nauseous and light headed.

## 2022-09-28 NOTE — ED Provider Notes (Signed)
  Physical Exam  BP 103/75   Pulse 82   Temp (!) 97.5 F (36.4 C) (Oral)   Resp 18   SpO2 97%   Physical Exam HENT:     Head: Normocephalic and atraumatic.     Mouth/Throat:     Mouth: Mucous membranes are moist.  Eyes:     General: Lids are normal.     Extraocular Movements: Extraocular movements intact.     Funduscopic exam:    Right eye: Hemorrhage present. No exudate. Red reflex present.  Musculoskeletal:     Cervical back: Normal range of motion and neck supple. No rigidity.  Neurological:     Mental Status: She is alert.     Procedures  Procedures  ED Course / MDM    Medical Decision Making Amount and/or Complexity of Data Reviewed Radiology: ordered.  Risk Prescription drug management.   Signed out to me by previous provider pending MRI for migraine with visual changes.  Migraine is completely resolved and patient is feeling much better, she states to me that the changes are right at her been going on for a week and she feels like it is a "blood spot".  Her eye doctor in the past has told her that she had some blood in the back of her eye but she has never seen anything in her vision before it is only certain if she moves her eye that she sees a spot.  I did a bedside ultrasound and there was no evidence of retinal detachment or vitreous detachment or vitreous hemorrhage.  On my very limited ophthalmoscopic exam patient does seem to have a small retinal hemorrhage.  Discussed with patient this is ongoing issue but is very important that she follows up on Monday with ophthalmology and advised on strict return precautions.       Darci Current 09/28/22 2029    Isla Pence, MD 09/28/22 2045

## 2022-10-01 DIAGNOSIS — H43811 Vitreous degeneration, right eye: Secondary | ICD-10-CM | POA: Diagnosis not present

## 2022-10-01 DIAGNOSIS — H1712 Central corneal opacity, left eye: Secondary | ICD-10-CM | POA: Diagnosis not present

## 2022-10-01 DIAGNOSIS — H17823 Peripheral opacity of cornea, bilateral: Secondary | ICD-10-CM | POA: Diagnosis not present

## 2022-10-01 DIAGNOSIS — H02422 Myogenic ptosis of left eyelid: Secondary | ICD-10-CM | POA: Diagnosis not present

## 2022-10-01 DIAGNOSIS — H0102B Squamous blepharitis left eye, upper and lower eyelids: Secondary | ICD-10-CM | POA: Diagnosis not present

## 2022-10-01 DIAGNOSIS — Z961 Presence of intraocular lens: Secondary | ICD-10-CM | POA: Diagnosis not present

## 2022-10-01 DIAGNOSIS — H04123 Dry eye syndrome of bilateral lacrimal glands: Secondary | ICD-10-CM | POA: Diagnosis not present

## 2022-10-01 DIAGNOSIS — H0102A Squamous blepharitis right eye, upper and lower eyelids: Secondary | ICD-10-CM | POA: Diagnosis not present

## 2022-10-01 DIAGNOSIS — H348322 Tributary (branch) retinal vein occlusion, left eye, stable: Secondary | ICD-10-CM | POA: Diagnosis not present

## 2022-10-01 DIAGNOSIS — H40023 Open angle with borderline findings, high risk, bilateral: Secondary | ICD-10-CM | POA: Diagnosis not present

## 2022-11-20 DIAGNOSIS — Z961 Presence of intraocular lens: Secondary | ICD-10-CM | POA: Diagnosis not present

## 2022-11-20 DIAGNOSIS — H0102B Squamous blepharitis left eye, upper and lower eyelids: Secondary | ICD-10-CM | POA: Diagnosis not present

## 2022-11-20 DIAGNOSIS — H40023 Open angle with borderline findings, high risk, bilateral: Secondary | ICD-10-CM | POA: Diagnosis not present

## 2022-11-20 DIAGNOSIS — H17823 Peripheral opacity of cornea, bilateral: Secondary | ICD-10-CM | POA: Diagnosis not present

## 2022-11-20 DIAGNOSIS — H0102A Squamous blepharitis right eye, upper and lower eyelids: Secondary | ICD-10-CM | POA: Diagnosis not present

## 2022-11-20 DIAGNOSIS — H348322 Tributary (branch) retinal vein occlusion, left eye, stable: Secondary | ICD-10-CM | POA: Diagnosis not present

## 2022-11-20 DIAGNOSIS — H1712 Central corneal opacity, left eye: Secondary | ICD-10-CM | POA: Diagnosis not present

## 2022-11-20 DIAGNOSIS — R519 Headache, unspecified: Secondary | ICD-10-CM | POA: Diagnosis not present

## 2022-11-20 DIAGNOSIS — H02422 Myogenic ptosis of left eyelid: Secondary | ICD-10-CM | POA: Diagnosis not present

## 2022-11-20 DIAGNOSIS — H43811 Vitreous degeneration, right eye: Secondary | ICD-10-CM | POA: Diagnosis not present

## 2022-11-20 DIAGNOSIS — H04123 Dry eye syndrome of bilateral lacrimal glands: Secondary | ICD-10-CM | POA: Diagnosis not present

## 2023-01-01 DIAGNOSIS — H43811 Vitreous degeneration, right eye: Secondary | ICD-10-CM | POA: Diagnosis not present

## 2023-01-01 DIAGNOSIS — H17823 Peripheral opacity of cornea, bilateral: Secondary | ICD-10-CM | POA: Diagnosis not present

## 2023-01-01 DIAGNOSIS — H401123 Primary open-angle glaucoma, left eye, severe stage: Secondary | ICD-10-CM | POA: Diagnosis not present

## 2023-01-01 DIAGNOSIS — H1712 Central corneal opacity, left eye: Secondary | ICD-10-CM | POA: Diagnosis not present

## 2023-01-01 DIAGNOSIS — H348322 Tributary (branch) retinal vein occlusion, left eye, stable: Secondary | ICD-10-CM | POA: Diagnosis not present

## 2023-01-01 DIAGNOSIS — Z961 Presence of intraocular lens: Secondary | ICD-10-CM | POA: Diagnosis not present

## 2023-01-01 DIAGNOSIS — H02422 Myogenic ptosis of left eyelid: Secondary | ICD-10-CM | POA: Diagnosis not present

## 2023-01-01 DIAGNOSIS — H40021 Open angle with borderline findings, high risk, right eye: Secondary | ICD-10-CM | POA: Diagnosis not present

## 2023-01-01 DIAGNOSIS — H04123 Dry eye syndrome of bilateral lacrimal glands: Secondary | ICD-10-CM | POA: Diagnosis not present

## 2023-01-07 DIAGNOSIS — I10 Essential (primary) hypertension: Secondary | ICD-10-CM | POA: Diagnosis not present

## 2023-01-07 DIAGNOSIS — Z5948 Other specified lack of adequate food: Secondary | ICD-10-CM | POA: Diagnosis not present

## 2023-01-07 DIAGNOSIS — H409 Unspecified glaucoma: Secondary | ICD-10-CM | POA: Diagnosis not present

## 2023-01-07 DIAGNOSIS — K219 Gastro-esophageal reflux disease without esophagitis: Secondary | ICD-10-CM | POA: Diagnosis not present

## 2023-01-07 DIAGNOSIS — Z8249 Family history of ischemic heart disease and other diseases of the circulatory system: Secondary | ICD-10-CM | POA: Diagnosis not present

## 2023-01-07 DIAGNOSIS — Z5982 Transportation insecurity: Secondary | ICD-10-CM | POA: Diagnosis not present

## 2023-01-07 DIAGNOSIS — H547 Unspecified visual loss: Secondary | ICD-10-CM | POA: Diagnosis not present

## 2023-01-07 DIAGNOSIS — Z823 Family history of stroke: Secondary | ICD-10-CM | POA: Diagnosis not present

## 2023-01-07 DIAGNOSIS — J449 Chronic obstructive pulmonary disease, unspecified: Secondary | ICD-10-CM | POA: Diagnosis not present

## 2023-01-07 DIAGNOSIS — H5360 Unspecified night blindness: Secondary | ICD-10-CM | POA: Diagnosis not present

## 2023-01-07 DIAGNOSIS — E785 Hyperlipidemia, unspecified: Secondary | ICD-10-CM | POA: Diagnosis not present

## 2023-01-13 IMAGING — MG MM DIGITAL SCREENING BILAT W/ TOMO AND CAD
8 series · 8 of 24 positions shown · non-contrast
Comparison: Previous exam(s).

CLINICAL DATA: Screening.

EXAM:
DIGITAL SCREENING BILATERAL MAMMOGRAM WITH TOMOSYNTHESIS AND CAD
TECHNIQUE: Bilateral screening digital craniocaudal and mediolateral oblique
mammograms were obtained. Bilateral screening digital breast
tomosynthesis was performed. The images were evaluated with
computer-aided detection.

[R CC synth-2D]
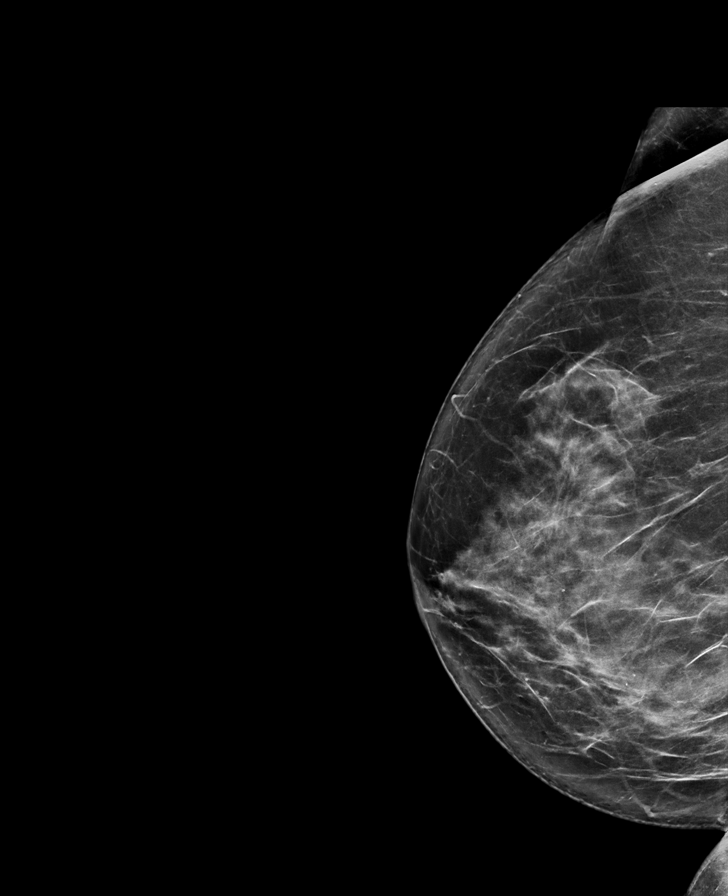

[L MLO synth-2D]
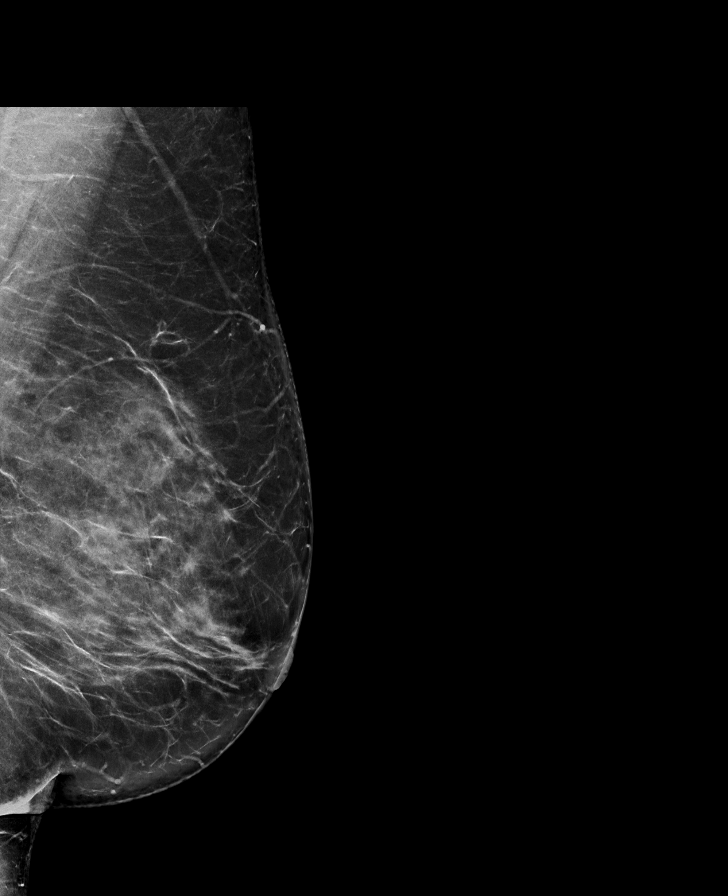

[L CC synth-2D]
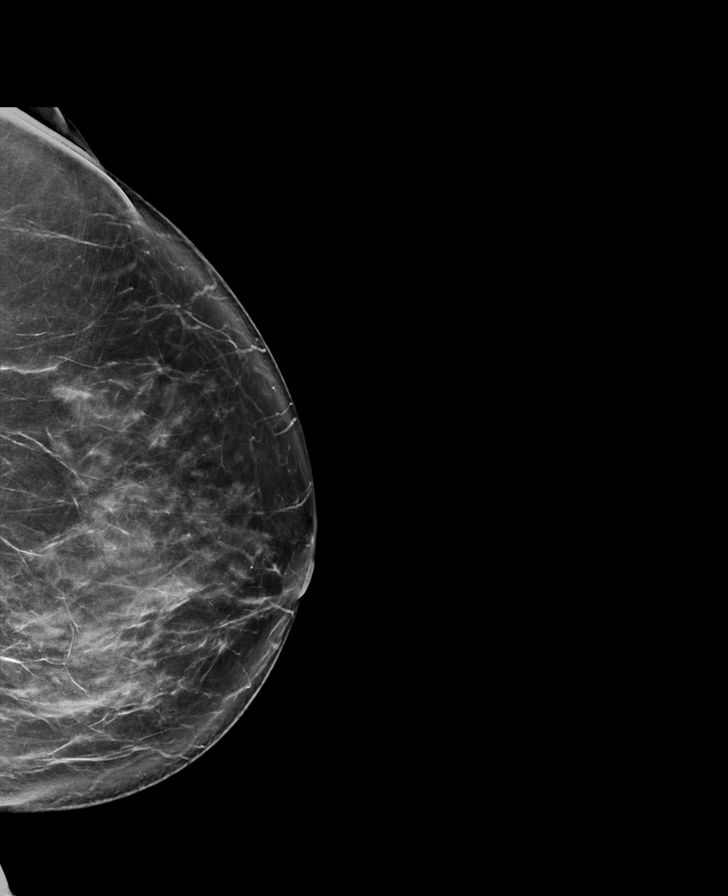

[R MLO synth-2D]
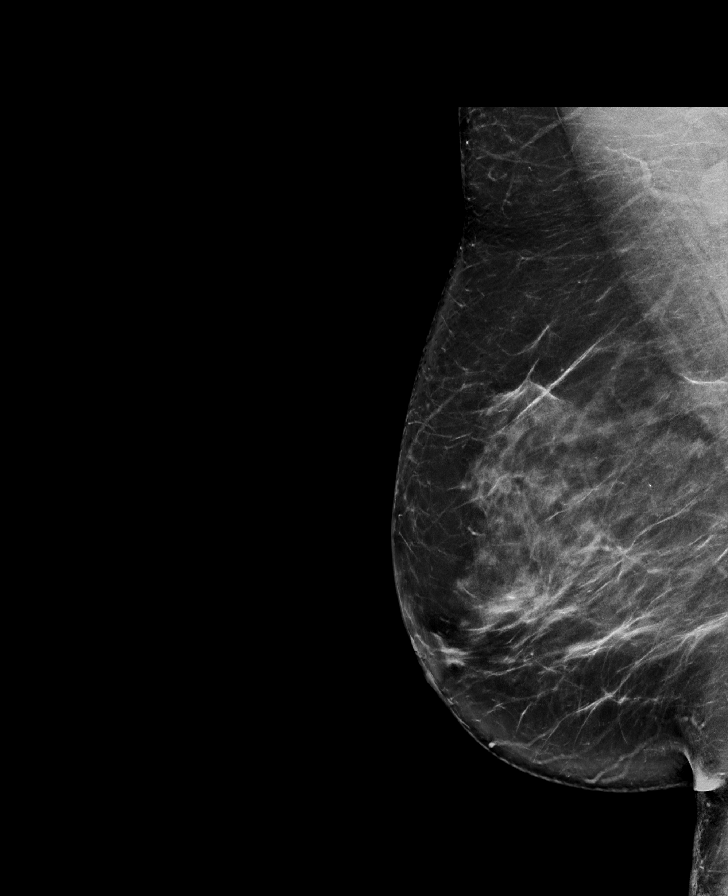

[R CC tomo · tomo slice 41/80.0]
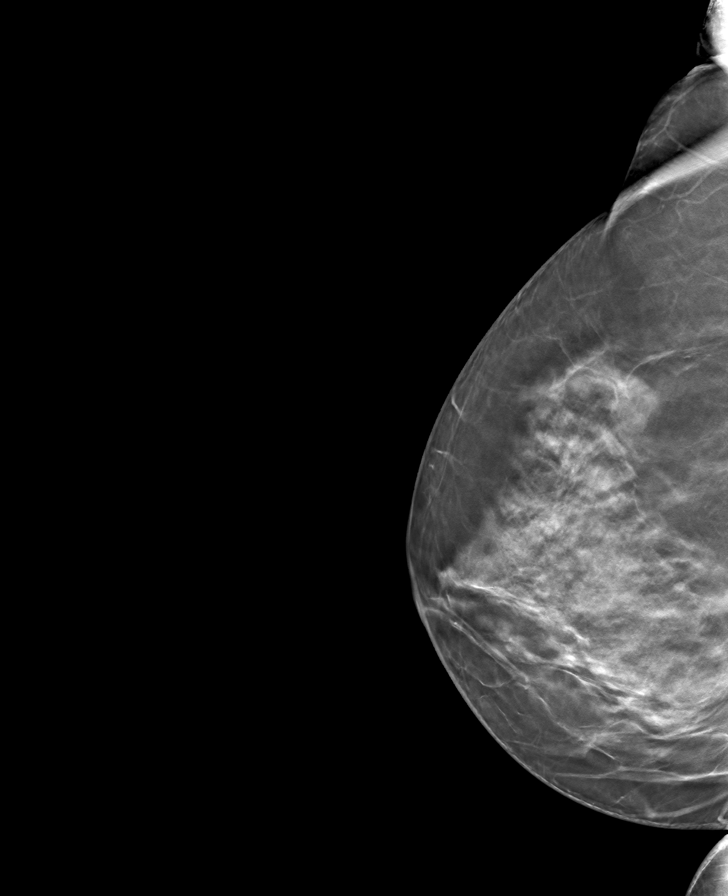

[R MLO tomo · tomo slice 47/92.0]
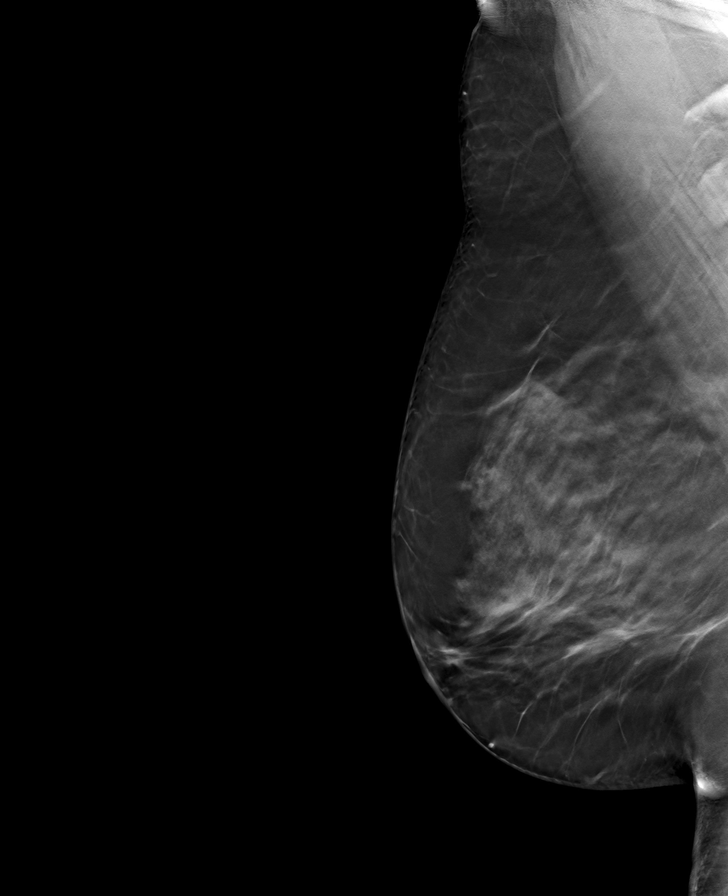

[L MLO tomo · tomo slice 44/87.0]
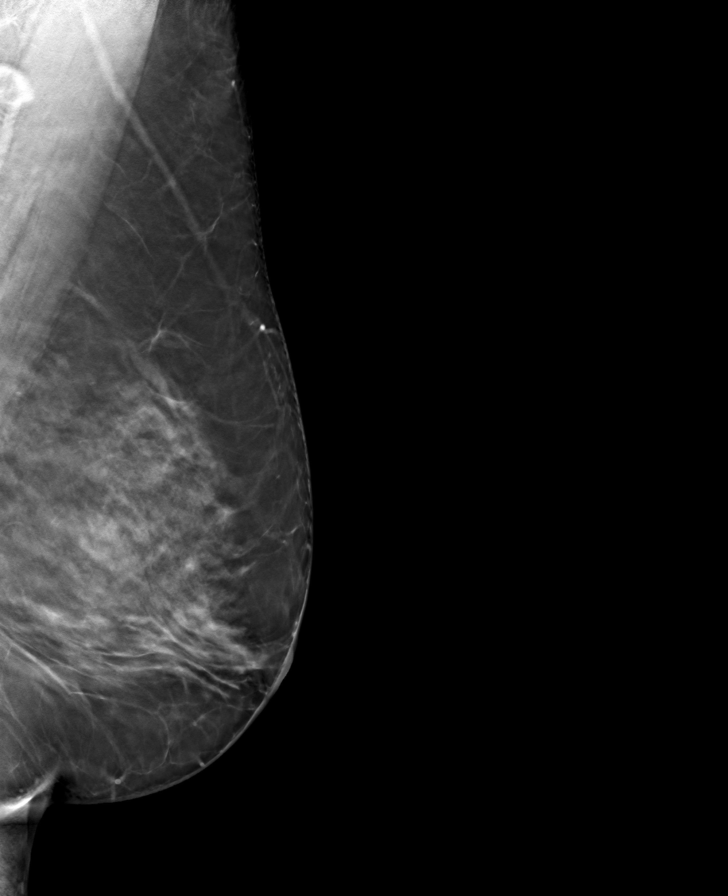

[L CC tomo · tomo slice 44/87.0]
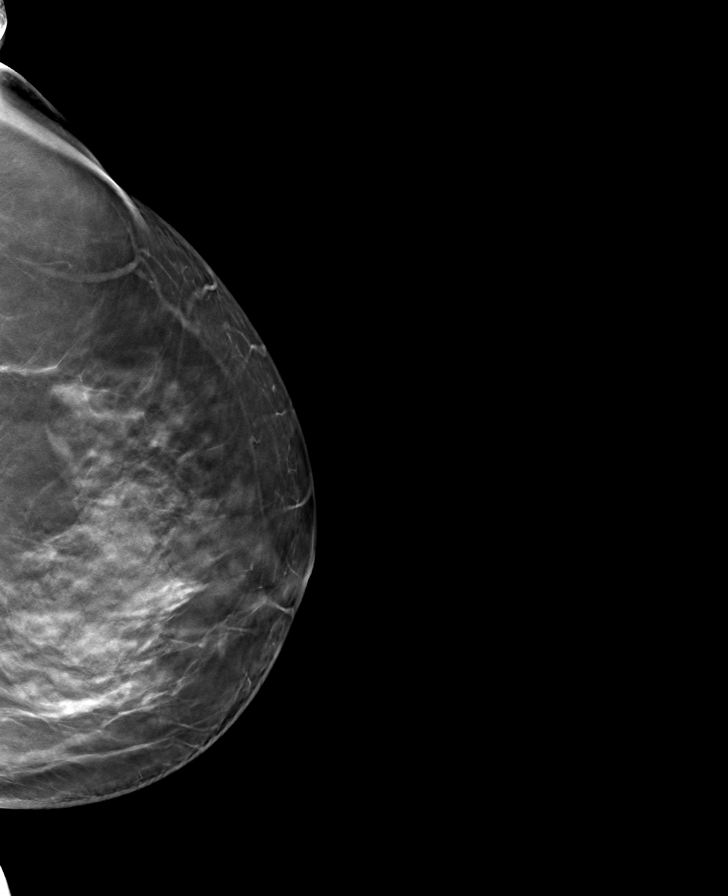

[8 of 24 positions shown; findings below may reference images not displayed]

ACR Breast Density Category c: The breast tissue is heterogeneously
dense, which may obscure small masses.
FINDINGS: There are no findings suspicious for malignancy.
IMPRESSION: No mammographic evidence of malignancy. A result letter of this
screening mammogram will be mailed directly to the patient.

RECOMMENDATION:
Screening mammogram in one year. (Code:Q3-W-BC3)

BI-RADS CATEGORY  1: Negative.

## 2023-01-28 DIAGNOSIS — M25512 Pain in left shoulder: Secondary | ICD-10-CM | POA: Diagnosis not present

## 2023-01-28 DIAGNOSIS — Z1211 Encounter for screening for malignant neoplasm of colon: Secondary | ICD-10-CM | POA: Diagnosis not present

## 2023-01-28 DIAGNOSIS — N950 Postmenopausal bleeding: Secondary | ICD-10-CM | POA: Diagnosis not present

## 2023-01-28 DIAGNOSIS — I10 Essential (primary) hypertension: Secondary | ICD-10-CM | POA: Diagnosis not present

## 2023-01-28 DIAGNOSIS — K5904 Chronic idiopathic constipation: Secondary | ICD-10-CM | POA: Diagnosis not present

## 2023-01-28 DIAGNOSIS — M25511 Pain in right shoulder: Secondary | ICD-10-CM | POA: Diagnosis not present

## 2023-01-28 DIAGNOSIS — Z1231 Encounter for screening mammogram for malignant neoplasm of breast: Secondary | ICD-10-CM | POA: Diagnosis not present

## 2023-01-28 DIAGNOSIS — E7800 Pure hypercholesterolemia, unspecified: Secondary | ICD-10-CM | POA: Diagnosis not present

## 2023-01-28 DIAGNOSIS — E559 Vitamin D deficiency, unspecified: Secondary | ICD-10-CM | POA: Diagnosis not present

## 2023-01-30 ENCOUNTER — Encounter: Payer: Self-pay | Admitting: Neurology

## 2023-01-30 ENCOUNTER — Ambulatory Visit: Payer: 59 | Admitting: Neurology

## 2023-01-30 VITALS — BP 120/72 | HR 82 | Ht 61.0 in | Wt 200.0 lb

## 2023-01-30 DIAGNOSIS — Z9189 Other specified personal risk factors, not elsewhere classified: Secondary | ICD-10-CM | POA: Diagnosis not present

## 2023-01-30 DIAGNOSIS — G4440 Drug-induced headache, not elsewhere classified, not intractable: Secondary | ICD-10-CM | POA: Diagnosis not present

## 2023-01-30 DIAGNOSIS — G4486 Cervicogenic headache: Secondary | ICD-10-CM | POA: Diagnosis not present

## 2023-01-30 DIAGNOSIS — R0683 Snoring: Secondary | ICD-10-CM

## 2023-01-30 DIAGNOSIS — R519 Headache, unspecified: Secondary | ICD-10-CM

## 2023-01-30 DIAGNOSIS — R0681 Apnea, not elsewhere classified: Secondary | ICD-10-CM | POA: Diagnosis not present

## 2023-01-30 DIAGNOSIS — G43019 Migraine without aura, intractable, without status migrainosus: Secondary | ICD-10-CM

## 2023-01-30 DIAGNOSIS — R351 Nocturia: Secondary | ICD-10-CM | POA: Diagnosis not present

## 2023-01-30 MED ORDER — PROPRANOLOL HCL 20 MG PO TABS: 20.0000 mg | ORAL_TABLET | Freq: Three times a day (TID) | ORAL | 5 refills | Status: DC

## 2023-01-30 MED ORDER — RIZATRIPTAN BENZOATE 5 MG PO TBDP: 5.0000 mg | ORAL_TABLET | ORAL | 1 refills | Status: DC | PRN

## 2023-01-30 NOTE — Progress Notes (Addendum)
Subjective:    Patient ID: Kari Blake is a 57 y.o. female.  HPI    Huston Foley, MD, PhD [facility] [address] [address] [place], [place] [address]  Dear Dr. Dione Booze,  I saw your patient, Kari Blake, upon your kind request in my neurologic clinic today for initial consultation of her recurrent headaches, concern for migraines.  The patient is unaccompanied today.  As you know, [name] is a 57 year old female with an underlying medical history of hypertension, glaucoma suspect, left eye injury as a child, with left eye blindness, arthritis, back pain, and obesity, who reports a longstanding history of migraine headaches essentially since childhood.  She reports that she has had some worsening of her headaches.  She reports neck pain and sometimes the headache starts in the back of her head and radiates upwards.  She does not sleep well, she snores and has been told that she stops breathing in her sleep.  She has never had a sleep study.  Her brother has sleep apnea and uses a CPAP machine.  She also has a family history of migraine headaches affecting her sister and niece.  She does not currently take any migraine medication by prescription, her primary care has not suggested any preventative or acute medication for migraine management.  She does take high-dose ibuprofen, over-the-counter strength but 6 pills/day and Excedrin over-the-counter also 6 pills a day. Her headaches are almost daily, they can be throbbing but sometimes are constant.  She has mostly right-sided headaches.  She has associated nausea and vomiting and some light sensitivity.  She does not sleep well.  She has sleep disruption.  Nocturia about 3-4 times per night.  She has woken up often with a headache.  She drinks caffeine in the form of coffee, about 3 cups/day and soda, about 2-3 times a week.  She is a non-smoker and drinks alcohol rarely.  She has never had any one-sided  weakness or numbness or tingling or droopy face or slurring of speech.  She reports that she takes antidepressant but is not sure about the name.  She was also started on a second glaucoma medication recently, eyedrops.  She wears a contact lens on the right side. I reviewed your office visit note from 11/20/2022, she was started on Lumigan eyedrops. She presented to the emergency room on 09/28/2022 with a headache.  I reviewed the emergency room records.  She was treated symptomatically with metoclopramide, diphenhydramine, ketorolac.  She had a head CT without contrast through the emergency room on 09/28/2022 and I reviewed the results: Impression: No acute intracranial abnormality.  She had a brain MRI with and without contrast through the emergency room on 09/28/2022 and I reviewed the results: Impression: Normal brain MRI.   Her Epworth sleepiness score is 13 out of 24, fatigue severity score is 35 out of 63.  Her Past Medical History Is Significant For: Past Medical History:  Diagnosis Date   Fibroid    Headache    Hypertension    Trichomonas infection     Her Past Surgical History Is Significant For: Past Surgical History:  Procedure Laterality Date   CESAREAN SECTION     EYE SURGERY     TUBAL LIGATION      Her Family History Is Significant For: Family History  Problem Relation Age of Onset   Obesity Other    Hypertension Other    Glaucoma Other    Cancer Other  Migraines Neg Hx     Her Social History Is Significant For: Social History   Socioeconomic History   Marital status: Single    Spouse name: Not on file   Number of children: Not on file   Years of education: Not on file   Highest education level: Not on file  Occupational History   Not on file  Tobacco Use   Smoking status: Never   Smokeless tobacco: Never  Vaping Use   Vaping Use: Never used  Substance and Sexual Activity   Alcohol use: Yes    Comment: rare   Drug use: No   Sexual activity: Not on file   Other Topics Concern   Not on file  Social History Narrative   Not on file   Social Determinants of Health   Financial Resource Strain: Not on file  Food Insecurity: Food Insecurity Present (05/02/2021)   Hunger Vital Sign    Worried About Running Out of Food in the Last Year: Sometimes true    Ran Out of Food in the Last Year: Often true  Transportation Needs: Unmet Transportation Needs (05/02/2021)   PRAPARE - Administrator, Civil Service (Medical): No    Lack of Transportation (Non-Medical): Yes  Physical Activity: Not on file  Stress: Not on file  Social Connections: Not on file    Her Allergies Are:  No Known Allergies:   Her Current Medications Are:  Outpatient Encounter Medications as of 01/30/2023  Medication Sig   acetaminophen (TYLENOL) 325 MG tablet Take 2 tablets (650 mg total) by mouth every 6 (six) hours as needed for mild pain or headache (fever >/= 101).   amLODipine (NORVASC) 10 MG tablet Take 1 tablet (10 mg total) by mouth daily.   aspirin-acetaminophen-caffeine (EXCEDRIN MIGRAINE) 250-250-65 MG tablet Take 2 tablets by mouth every 6 (six) hours as needed for migraine (headache).   benzonatate (TESSALON) 100 MG capsule Take 1 capsule (100 mg total) by mouth 3 (three) times daily as needed for cough.   gabapentin (NEURONTIN) 300 MG capsule Take 300 mg by mouth 2 (two) times daily.   levalbuterol (XOPENEX HFA) 45 MCG/ACT inhaler Inhale into the lungs.   ondansetron (ZOFRAN ODT) 4 MG disintegrating tablet 4mg  ODT q4 hours prn nausea/vomit   potassium chloride (KLOR-CON) 10 MEQ tablet Take 1 tablet (10 mEq total) by mouth daily.   No facility-administered encounter medications on file as of 01/30/2023.  :   Review of Systems:  Out of a complete 14 point review of systems, all are reviewed and negative with the exception of these symptoms as listed below:   Review of Systems  Neurological:        Pt here for Migraines Pt states 5 Migraines in last  month Pt states Migraine pain starts in right temporal and travels to back of head     Objective:  Neurological Exam  Physical Exam Physical Examination:   Vitals:   01/30/23 1459  BP: 120/72  Pulse: 82   General Examination: The patient is a very pleasant 57 y.o. female in no acute distress. She appears well-developed and well-nourished and well groomed.   HEENT: Normocephalic, right-sided contact lens in place.  Left eye blindness, corneal opacification, left eye ptosis.  Right eye reactive to light, no photophobia.  Tracking well-preserved.  Hearing grossly intact.  Face is symmetric with normal facial animation, speech is clear without dysarthria, hypophonia or voice tremor.  Airway examination reveals moderate airway crowding.  Neck circumference  14 5/8 inches.  Tongue protrudes centrally and palate elevates symmetrically.    Chest: Clear to auscultation without wheezing, rhonchi or crackles noted.  Heart: S1+S2+0, regular and normal without murmurs, rubs or gallops noted.   Abdomen: Soft, non-tender and non-distended.  Extremities: There is no pitting edema in the distal lower extremities bilaterally.   Skin: Warm and dry without trophic changes noted.   Musculoskeletal: exam reveals no obvious joint deformities but she has decreased range of motion in both shoulder areas.   Neurologically:  Mental status: The patient is awake, alert and oriented in all 4 spheres. Her immediate and remote memory, attention, language skills and fund of knowledge are appropriate. There is no evidence of aphasia, agnosia, apraxia or anomia. Speech is clear with normal prosody and enunciation. Thought process is linear. Mood is normal and affect is normal.  Cranial nerves II - XII are as described above under HEENT exam.  Motor exam: Normal bulk, strength and tone is noted. There is no obvious action or resting tremor.  Fine motor skills and coordination: grossly intact.  Cerebellar testing: No  dysmetria or intention tremor. There is no truncal or gait ataxia.  Sensory exam: intact to light touch in the upper and lower extremities.  Gait, station and balance: She stands with difficulty and walks with a limp. No walking aid. Assessment and Plan:  In summary, RENISE RIBBLE is a very pleasant 57 y.o.-year old female with an underlying medical history of hypertension, glaucoma suspect, left eye injury as a child, with left eye blindness, arthritis, back pain, and obesity, who presents for evaluation of her recurrent headaches, including a history of migraines since childhood.  Her headache is history is complicated as she probably has a mixed picture currently, headaches from sleep deprivation and underlying potential sleep apnea, headaches from neck pain, medication overuse headaches, and migraines.  I talked to the patient at length today and explained that these different factors can affect her headaches.    She is strongly advised to not take daily ibuprofen or Excedrin any longer and come off of these medications.  She admits that her primary care has also recommended that she stop these medications as she is supposed to start Mobic as I understand.  She is advised to limit her caffeine.  She is advised to stay well-hydrated with water and work on good sleep habits and weight loss.    She is advised to talk to her primary care about neck pain and potentially seeing a spine specialist.  She is advised to start a preventative medication for headaches/migraines in the form of propranolol.  I had initially suggested we start her on amitriptyline but she reports that she is on an antidepressant currently.  She is willing to call us back to update [place] with the name of her antidepressant medication.  She will start propranolol low-dose, 20 mg strength 1 pill once daily and slowly build it up to up to 1 pill 3 times daily in the next few weeks.   She is advised to start Maxalt 5 mg strength ODT  as needed for migraine attacks.   She had a relatively recent brain MRI which was unremarkable.  She had a recent updated eye exam through your office.  Neurological exam is nonfocal, she has joint pain.  We talked about concern for sleep apnea as well today.  She is advised to proceed with a home sleep test to evaluate her for sleep apnea.  If she has  obstructive sleep apnea, she is advised to consider treatment with an AutoPap machine.  I was able to show her a model for an AutoPap machine.  She had some additional questions about CPAP therapy in general and concerns, her questions were answered.  We will plan to follow-up in about 3 months, sooner if needed, and we will keep her posted as to her sleep test results by phone call as well.   She was provided with written instructions and 2 prescriptions today.  This was an extended visit of over 60 minutes with multiple problems addressed, and explanation of tests and test results, coordination of care and counseling.  Thank you very much for allowing me to participate in the care of this nice patient. If I can be of any further assistance to you please do not hesitate to call me at [phone].  Sincerely,   Huston Foley, MD, PhD

## 2023-01-30 NOTE — Patient Instructions (Addendum)
Please remember, common headache triggers are: sleep deprivation, dehydration, overheating, stress, hypoglycemia or skipping meals and blood sugar fluctuations, excessive pain medications or excessive alcohol use or caffeine withdrawal. Some people have food triggers such as aged cheese, orange juice or chocolate, especially dark chocolate, or MSG (monosodium glutamate). Try to avoid these headache triggers as much possible. It may be helpful to keep a headache diary to figure out what makes your headaches worse or brings them on and what alleviates them. Some people report headache onset after exercise but studies have shown that regular exercise may actually prevent headaches from coming. If you have exercise-induced headaches, please make sure that you drink plenty of fluid before and after exercising and that you do not over do it and do not overheat.  For migraine prevention, I would like to suggest: Inderal (generic name: propranolol) 20 mg strength: take 1 pill each bedtime for 1 week, then 1 pill twice daily for 2 weeks, then 1 pill 3 times a day thereafter. Common side effect reported are: lethargy, sedation, low blood pressure and low pulse rate. Please monitor your BP and Pulse every few days, if your pulse drops lower than 55 you may feel bad and we may have to adjust your dose. Same with your BP below 110/55.   You recently had a brain MRI with and without contrast through the ER; it was benign.   I will order a home sleep test for evaluation of sleep apnea. As discussed, if you have obstructive sleep apnea, I will likely recommend treatment with an autoPAP machine.   For migraine acute treatment, try the Maxalt orally disintegrating tab, 5 mg: take 1 pill early on when you suspect a migraine attack come on. You may take another pill within 2 hours, no more than 2 pills in 24 hours. Most people who take triptans do not have any serious side-effects. However, they can cause drowsiness (remember  to not drive or use heavy machinery when drowsy), nausea, dizziness, dry mouth. Less common side effects include strange sensations, such as tightness in your chest or throat, tingling, flushing, and feelings of heaviness or pressure in areas such as the face, limbs, and chest. These in the chest can mimic heart related pain (angina) and may cause alarm, but usually these sensations are not harmful or a sign of a heart attack. However, if you develop intense chest pain or sensations of discomfort, you should stop taking your medication and consult with me or your PCP or go to the nearest urgent care facility or ER or call 911.   Please reduce and stop taking your daily high dose ibuprofen and excedrin, as these can cause medication overuse headaches.   Your headaches may also stem from neck pain and from underlying sleep apnea.   For neck pain I recommend you see your primary care. You may benefit from seeing a spine specialist.

## 2023-02-07 DIAGNOSIS — L255 Unspecified contact dermatitis due to plants, except food: Secondary | ICD-10-CM | POA: Diagnosis not present

## 2023-05-08 ENCOUNTER — Ambulatory Visit: Payer: 59 | Admitting: Neurology

## 2023-07-03 ENCOUNTER — Other Ambulatory Visit: Payer: Self-pay | Admitting: Neurology

## 2023-07-16 ENCOUNTER — Ambulatory Visit: Payer: 59 | Admitting: Neurology

## 2023-07-16 ENCOUNTER — Telehealth: Payer: Self-pay | Admitting: Neurology

## 2023-07-16 NOTE — Telephone Encounter (Signed)
NS for FU appt.

## 2023-08-10 ENCOUNTER — Other Ambulatory Visit: Payer: Self-pay | Admitting: Neurology

## 2023-08-24 ENCOUNTER — Other Ambulatory Visit: Payer: Self-pay | Admitting: Neurology

## 2023-12-12 DIAGNOSIS — N950 Postmenopausal bleeding: Secondary | ICD-10-CM | POA: Diagnosis not present

## 2023-12-12 DIAGNOSIS — D219 Benign neoplasm of connective and other soft tissue, unspecified: Secondary | ICD-10-CM | POA: Diagnosis not present

## 2023-12-16 DIAGNOSIS — G8929 Other chronic pain: Secondary | ICD-10-CM | POA: Diagnosis not present

## 2023-12-16 DIAGNOSIS — H547 Unspecified visual loss: Secondary | ICD-10-CM | POA: Diagnosis not present

## 2023-12-16 DIAGNOSIS — I10 Essential (primary) hypertension: Secondary | ICD-10-CM | POA: Diagnosis not present

## 2023-12-16 DIAGNOSIS — F439 Reaction to severe stress, unspecified: Secondary | ICD-10-CM | POA: Diagnosis not present

## 2023-12-16 DIAGNOSIS — M25511 Pain in right shoulder: Secondary | ICD-10-CM | POA: Diagnosis not present

## 2023-12-16 DIAGNOSIS — Z0271 Encounter for disability determination: Secondary | ICD-10-CM | POA: Diagnosis not present

## 2023-12-16 DIAGNOSIS — M5442 Lumbago with sciatica, left side: Secondary | ICD-10-CM | POA: Diagnosis not present

## 2023-12-16 DIAGNOSIS — M25512 Pain in left shoulder: Secondary | ICD-10-CM | POA: Diagnosis not present

## 2023-12-24 DIAGNOSIS — D219 Benign neoplasm of connective and other soft tissue, unspecified: Secondary | ICD-10-CM | POA: Diagnosis not present

## 2023-12-25 DIAGNOSIS — Z48816 Encounter for surgical aftercare following surgery on the genitourinary system: Secondary | ICD-10-CM | POA: Diagnosis not present

## 2024-01-07 DIAGNOSIS — D219 Benign neoplasm of connective and other soft tissue, unspecified: Secondary | ICD-10-CM | POA: Diagnosis not present

## 2024-01-07 DIAGNOSIS — Z9889 Other specified postprocedural states: Secondary | ICD-10-CM | POA: Diagnosis not present

## 2024-03-12 DIAGNOSIS — Z09 Encounter for follow-up examination after completed treatment for conditions other than malignant neoplasm: Secondary | ICD-10-CM | POA: Diagnosis not present
